# Patient Record
Sex: Female | Born: 1945 | Race: White | Hispanic: No | Marital: Married | Smoking: Former smoker
Health system: Southern US, Community
[De-identification: ages and names within clinical notes are randomized; demographics above are authoritative.]

## PROBLEM LIST (undated history)

## (undated) DIAGNOSIS — R001 Bradycardia, unspecified: Secondary | ICD-10-CM

## (undated) DIAGNOSIS — I10 Essential (primary) hypertension: Secondary | ICD-10-CM

## (undated) DIAGNOSIS — R740 Nonspecific elevation of levels of transaminase and lactic acid dehydrogenase [LDH]: Secondary | ICD-10-CM

## (undated) DIAGNOSIS — E119 Type 2 diabetes mellitus without complications: Secondary | ICD-10-CM

## (undated) DIAGNOSIS — R7401 Elevation of levels of liver transaminase levels: Secondary | ICD-10-CM

## (undated) HISTORY — DX: Type 2 diabetes mellitus without complications: E11.9

## (undated) HISTORY — DX: Elevation of levels of liver transaminase levels: R74.01

## (undated) HISTORY — DX: Essential (primary) hypertension: I10

## (undated) HISTORY — DX: Bradycardia, unspecified: R00.1

## (undated) HISTORY — DX: Nonspecific elevation of levels of transaminase and lactic acid dehydrogenase (ldh): R74.0

---

## 1998-12-04 ENCOUNTER — Other Ambulatory Visit: Admission: RE | Admit: 1998-12-04 | Discharge: 1998-12-04 | Payer: Self-pay | Admitting: Family Medicine

## 1998-12-07 ENCOUNTER — Other Ambulatory Visit: Admission: RE | Admit: 1998-12-07 | Discharge: 1998-12-07 | Payer: Self-pay | Admitting: Family Medicine

## 2001-03-17 ENCOUNTER — Other Ambulatory Visit: Admission: RE | Admit: 2001-03-17 | Discharge: 2001-03-17 | Payer: Self-pay | Admitting: Family Medicine

## 2003-02-21 ENCOUNTER — Other Ambulatory Visit: Admission: RE | Admit: 2003-02-21 | Discharge: 2003-02-21 | Payer: Self-pay | Admitting: Family Medicine

## 2003-06-15 ENCOUNTER — Inpatient Hospital Stay (HOSPITAL_COMMUNITY): Admission: RE | Admit: 2003-06-15 | Discharge: 2003-06-16 | Payer: Self-pay | Admitting: Neurosurgery

## 2003-08-21 ENCOUNTER — Encounter: Admission: RE | Admit: 2003-08-21 | Discharge: 2003-09-27 | Payer: Self-pay | Admitting: Neurosurgery

## 2004-02-23 ENCOUNTER — Other Ambulatory Visit: Admission: RE | Admit: 2004-02-23 | Discharge: 2004-02-23 | Payer: Self-pay | Admitting: Family Medicine

## 2004-10-18 ENCOUNTER — Ambulatory Visit: Payer: Self-pay | Admitting: Internal Medicine

## 2004-10-21 ENCOUNTER — Ambulatory Visit: Payer: Self-pay | Admitting: Cardiology

## 2004-10-24 ENCOUNTER — Ambulatory Visit: Payer: Self-pay | Admitting: Cardiology

## 2004-10-31 ENCOUNTER — Ambulatory Visit: Payer: Self-pay | Admitting: Cardiology

## 2004-11-06 ENCOUNTER — Ambulatory Visit: Payer: Self-pay | Admitting: Cardiology

## 2004-11-06 ENCOUNTER — Inpatient Hospital Stay (HOSPITAL_BASED_OUTPATIENT_CLINIC_OR_DEPARTMENT_OTHER): Admission: RE | Admit: 2004-11-06 | Discharge: 2004-11-06 | Payer: Self-pay | Admitting: Cardiology

## 2004-11-20 ENCOUNTER — Ambulatory Visit: Payer: Self-pay | Admitting: Cardiology

## 2005-01-21 ENCOUNTER — Ambulatory Visit: Payer: Self-pay | Admitting: Cardiology

## 2005-03-21 ENCOUNTER — Other Ambulatory Visit: Admission: RE | Admit: 2005-03-21 | Discharge: 2005-03-21 | Payer: Self-pay | Admitting: Family Medicine

## 2005-04-04 ENCOUNTER — Ambulatory Visit (HOSPITAL_COMMUNITY): Admission: RE | Admit: 2005-04-04 | Discharge: 2005-04-04 | Payer: Self-pay | Admitting: Otolaryngology

## 2005-04-09 ENCOUNTER — Ambulatory Visit: Payer: Self-pay | Admitting: Cardiology

## 2005-04-25 ENCOUNTER — Ambulatory Visit: Payer: Self-pay | Admitting: Internal Medicine

## 2005-05-07 ENCOUNTER — Ambulatory Visit: Payer: Self-pay | Admitting: Cardiology

## 2005-07-02 ENCOUNTER — Ambulatory Visit: Payer: Self-pay | Admitting: Internal Medicine

## 2006-06-26 IMAGING — CT CT PARANASAL SINUSES LIMITED
1 series · 10 of 12 positions shown, 13 images · IV contrast (agent unspecified)
Comparison: None.

CLINICAL DATA: Chronic sinusitis.
 LIMITED CT OF PARANASAL SINUSES WITHOUT CONTRAST:
TECHNIQUE: Limited coronal CT images were obtained through the paranasal sinuses without intravenous contrast.

[Series 9206: — · axial · 0.35mm/px · z∈[-648,-558]mm · 10 of 12 slices shown, 13 images]
[im 2/12  brain]
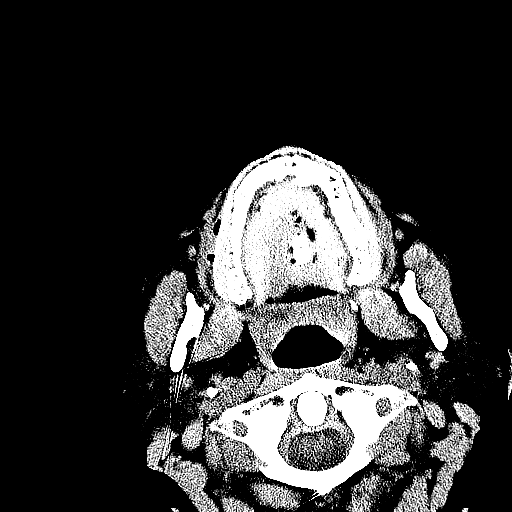
[im 2/12  bone]
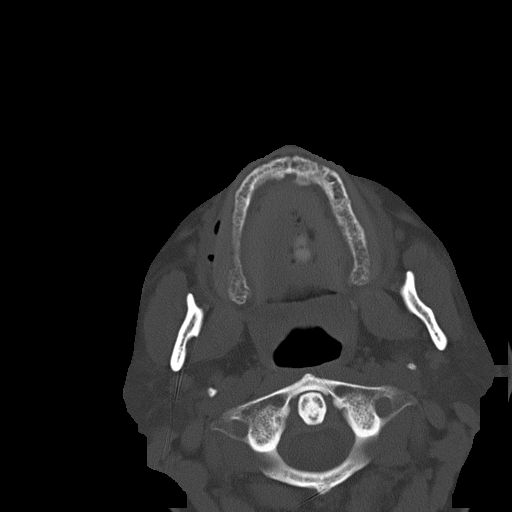
[im 3/12  bone]
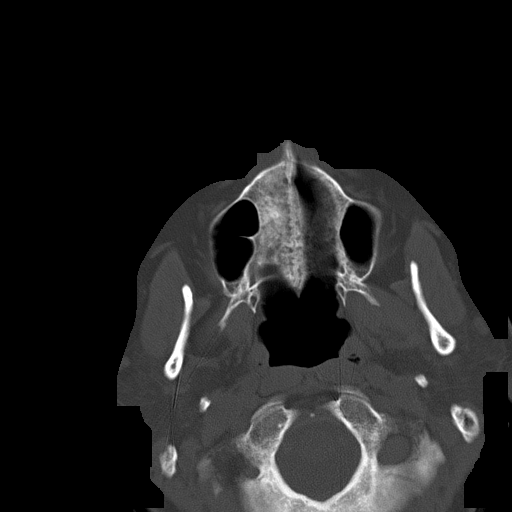
[im 4/12  bone]
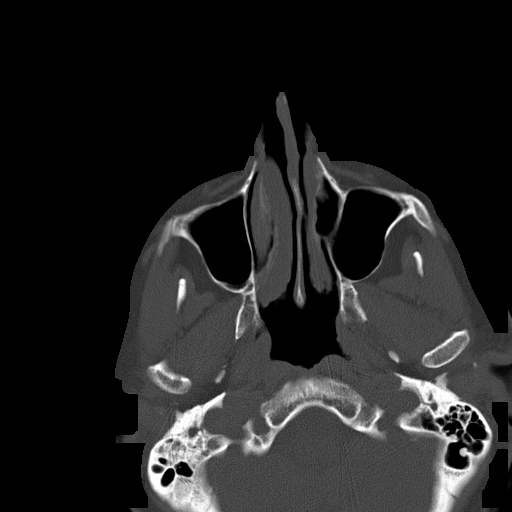
[im 5/12  bone]
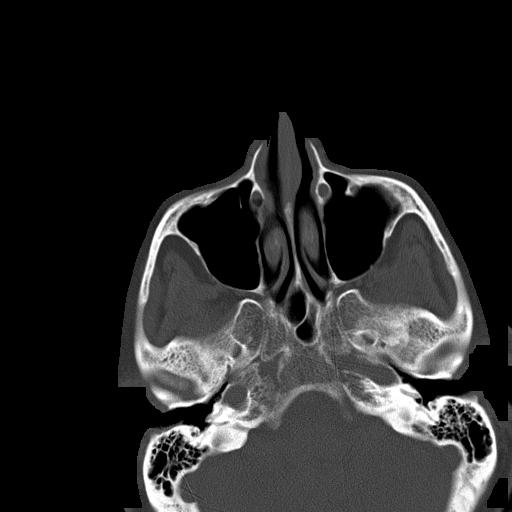
[im 6/12  brain]
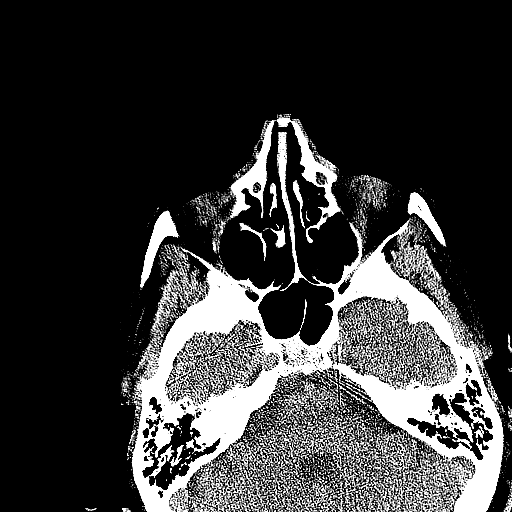
[im 6/12  bone]
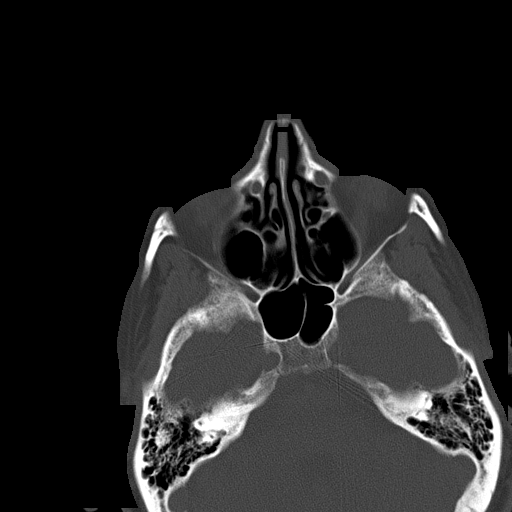
[im 7/12  bone]
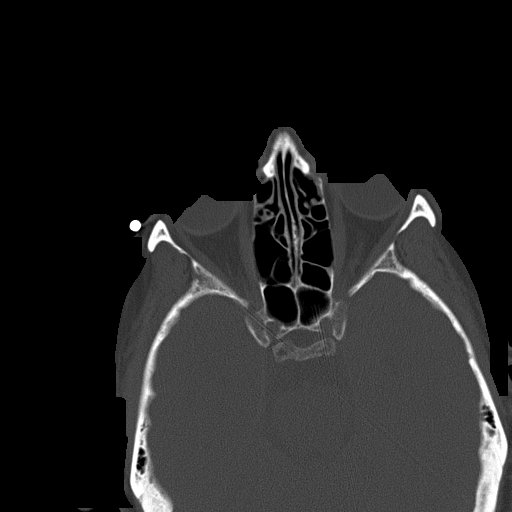
[im 8/12  bone]
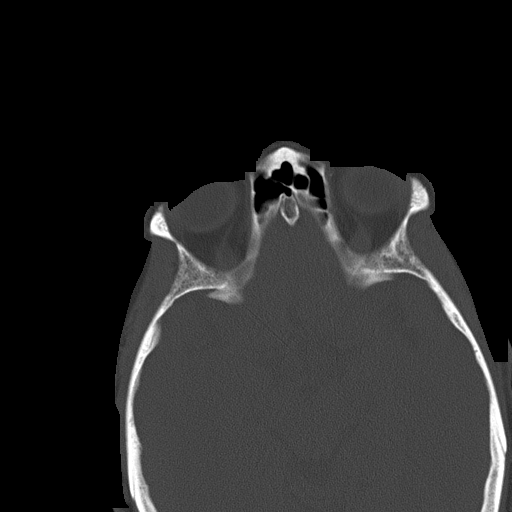
[im 9/12  bone]
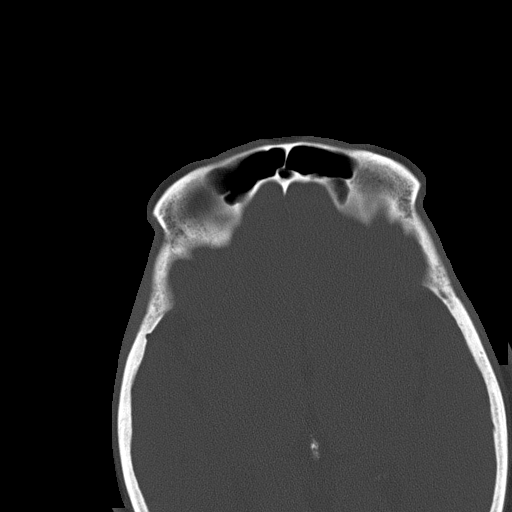
[im 10/12  brain]
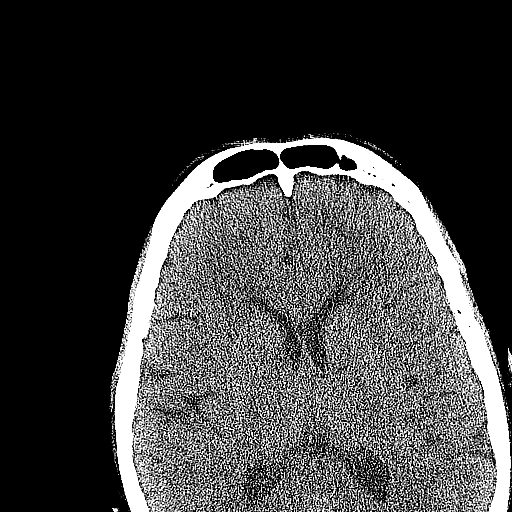
[im 10/12  bone]
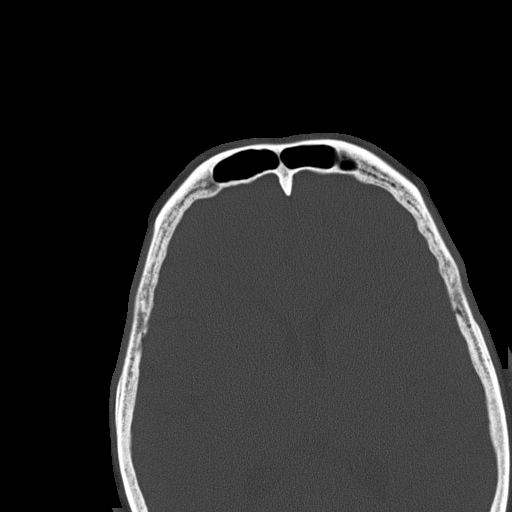
[im 11/12  bone]
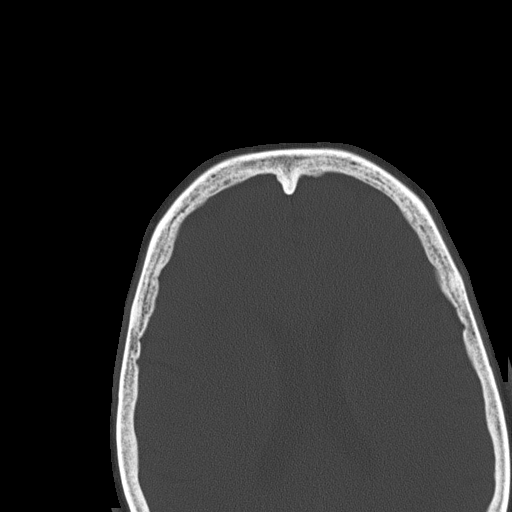

[10 of 12 positions shown; findings below may reference images not displayed]

FINDINGS: Noncontrast CT of the paranasal sinuses demonstrates patent sinuses.  There is no evidence for chronic or acute sinus disease.
IMPRESSION: Normal sinus CT.

## 2006-11-03 ENCOUNTER — Ambulatory Visit: Payer: Self-pay | Admitting: Internal Medicine

## 2006-11-03 LAB — CONVERTED CEMR LAB
Basophils Absolute: 0 10*3/uL (ref 0.0–0.1)
Basophils Relative: 0.4 % (ref 0.0–1.0)
Eosinophils Absolute: 0.1 10*3/uL (ref 0.0–0.6)
Eosinophils Relative: 1.7 % (ref 0.0–5.0)
HCT: 39.7 % (ref 36.0–46.0)
Hemoglobin: 13.9 g/dL (ref 12.0–15.0)
Lymphocytes Relative: 26.5 % (ref 12.0–46.0)
MCHC: 34.9 g/dL (ref 30.0–36.0)
MCV: 87.9 fL (ref 78.0–100.0)
Monocytes Absolute: 0.6 10*3/uL (ref 0.2–0.7)
Monocytes Relative: 7.5 % (ref 3.0–11.0)
Neutro Abs: 5.5 10*3/uL (ref 1.4–7.7)
Neutrophils Relative %: 63.9 % (ref 43.0–77.0)
Platelets: 216 10*3/uL (ref 150–400)
RBC: 4.52 M/uL (ref 3.87–5.11)
RDW: 12.3 % (ref 11.5–14.6)
TSH: 1.2 microintl units/mL (ref 0.35–5.50)
WBC: 8.4 10*3/uL (ref 4.5–10.5)

## 2006-11-10 ENCOUNTER — Ambulatory Visit: Payer: Self-pay | Admitting: Internal Medicine

## 2006-11-18 ENCOUNTER — Ambulatory Visit: Payer: Self-pay | Admitting: Internal Medicine

## 2006-11-26 ENCOUNTER — Ambulatory Visit: Payer: Self-pay | Admitting: Cardiology

## 2007-08-02 ENCOUNTER — Ambulatory Visit: Payer: Self-pay | Admitting: Internal Medicine

## 2007-08-09 ENCOUNTER — Ambulatory Visit: Payer: Self-pay | Admitting: Cardiology

## 2008-10-13 ENCOUNTER — Encounter: Payer: Self-pay | Admitting: Internal Medicine

## 2008-10-13 ENCOUNTER — Ambulatory Visit: Payer: Self-pay | Admitting: Internal Medicine

## 2008-10-13 DIAGNOSIS — I498 Other specified cardiac arrhythmias: Secondary | ICD-10-CM

## 2008-10-13 DIAGNOSIS — I10 Essential (primary) hypertension: Secondary | ICD-10-CM | POA: Insufficient documentation

## 2009-10-08 ENCOUNTER — Telehealth: Payer: Self-pay | Admitting: Internal Medicine

## 2009-10-08 ENCOUNTER — Encounter: Admission: RE | Admit: 2009-10-08 | Discharge: 2010-01-06 | Payer: Self-pay | Admitting: Rheumatology

## 2009-10-25 ENCOUNTER — Ambulatory Visit: Payer: Self-pay | Admitting: Internal Medicine

## 2009-10-25 DIAGNOSIS — E119 Type 2 diabetes mellitus without complications: Secondary | ICD-10-CM

## 2010-08-27 NOTE — Assessment & Plan Note (Signed)
Summary: 65 YR F/U  Medications Added HYDROCHLOROTHIAZIDE 25 MG TABS (HYDROCHLOROTHIAZIDE) take 1/2 tablet once daily      Allergies Added: NKDA  CC:  1 yr f/u.  Pt states that she feels well but still has an episode every now and then of the "flip-flop".   .  History of Present Illness: Stephanie Odom comes in without recurrent palpitations over the last year. She has had no significant chest pain. overall she's doing pre-well. Her exercise is limited by her knees. Unfortunately she has gained another 8 pounds  Current Medications (verified): 1)  Metformin Hcl 500 Mg Tabs (Metformin Hcl) .... Once Daily 2)  Amitriptyline Hcl 25 Mg Tabs (Amitriptyline Hcl) .... Take 2 Tabs At Bedtime 3)  Hydrochlorothiazide 25 Mg Tabs (Hydrochlorothiazide) .... Take 1/2 Tablet Once Daily 4)  Atenolol 25 Mg Tabs (Atenolol) .... Take 1 Tablet By Mouth Once A Day 5)  Ranitidine Hcl 150 Mg Tabs (Ranitidine Hcl) .... As Needed 6)  Cyclobenzaprine Hcl 10 Mg Tabs (Cyclobenzaprine Hcl) .... As Needed 7)  Crestor 10 Mg Tabs (Rosuvastatin Calcium) .... Take 1 Tablet By Mouth Once A Day 8)  Lorazepam 1 Mg Tabs (Lorazepam)  Allergies (verified): No Known Drug Allergies  Past History:  Family History: Last updated: 10/10/2008 FHx of diabetes Second hand cigarette exposure FHx hypertension Asthma dyslipidemia GERD  Social History: Last updated: 10/10/2008 [employer] Married  Tobacco Use - Former.  Alcohol Use - no Drug Use - no  Vital Signs:  Patient profile:   65 year old female Height:      62 inches Weight:      164 pounds BMI:     30.10 Pulse rate:   70 / minute Pulse rhythm:   regular BP sitting:   134 / 84  (right arm) Cuff size:   regular  Vitals Entered By: Judithe Modest CMA (October 25, 2009 10:07 AM)  Physical Exam  General:  The patient was alert and oriented in no acute distress. HEENT Normal.  Neck veins were flat, carotids were brisk.  Lungs were clear.  Heart sounds  were regular without murmurs or gallops.  Abdomen was soft with active bowel sounds. There is no clubbing cyanosis or edema. Skin Warm and dry    Impression & Recommendations:  Problem # 1:  HYPERTENSION, BENIGN (ICD-401.1) her blood pressure is up a little bit. Is not certain why she is not on an ACE inhibitor as she should be with her diabetes for renal protection. I will defer this to her primary care physician for right now. Her updated medication list for this problem includes:    Hydrochlorothiazide 25 Mg Tabs (Hydrochlorothiazide) .Marland Kitchen... Take 1/2 tablet once daily    Atenolol 25 Mg Tabs (Atenolol) .Marland Kitchen... Take 1 tablet by mouth once a day  Problem # 2:  SINUS TACHYCARDIA (ICD-427.89) heart rate is well controlled today on low-dose atenolol. Her updated medication list for this problem includes:    Atenolol 25 Mg Tabs (Atenolol) .Marland Kitchen... Take 1 tablet by mouth once a day  Orders: EKG w/ Interpretation (93000)  Problem # 3:  DM (ICD-250.00) issues are as above.   Her updated medication list for this problem includes:    Metformin Hcl 500 Mg Tabs (Metformin hcl) ..... Once daily  Patient Instructions: 1)  Your physician wants you to follow-up in: 12 months with Dr Graciela Husbands.  You will receive a reminder letter in the mail two months in advance. If you don't receive a letter, please call  our office to schedule the follow-up appointment.

## 2010-08-27 NOTE — Progress Notes (Signed)
Summary: refill meds   Phone Note Refill Request Call back at Home Phone [phone] Message from:  Patient on October 08, 2009 3:06 PM  Refills Requested: Medication #1:  ATENOLOL 25 MG TABS Take 1 tablet by mouth once a day [facility] [phone]   Method Requested: Fax to Mail Away Pharmacy Initial call taken by: Lorne Skeens,  October 08, 2009 3:07 PM    Prescriptions: ATENOLOL 25 MG TABS (ATENOLOL) Take 1 tablet by mouth once a day  #30 x 10   Entered by:   Judithe Modest CMA   Authorized by:   Nathen May, MD, [facility]   Signed by:   Judithe Modest CMA on 10/08/2009   Method used:   Electronically to        [facility] (mail-order)             ,          Ph: [phone]       Fax: [phone]   RxID:   [id number]

## 2010-11-04 ENCOUNTER — Encounter: Payer: Self-pay | Admitting: Internal Medicine

## 2010-11-06 ENCOUNTER — Encounter: Payer: Self-pay | Admitting: Internal Medicine

## 2010-11-07 ENCOUNTER — Other Ambulatory Visit: Payer: Self-pay | Admitting: Internal Medicine

## 2010-11-11 ENCOUNTER — Ambulatory Visit (INDEPENDENT_AMBULATORY_CARE_PROVIDER_SITE_OTHER): Payer: Medicare Other | Admitting: Internal Medicine

## 2010-11-11 ENCOUNTER — Encounter: Payer: Self-pay | Admitting: Internal Medicine

## 2010-11-11 DIAGNOSIS — I471 Supraventricular tachycardia: Secondary | ICD-10-CM

## 2010-11-11 DIAGNOSIS — E785 Hyperlipidemia, unspecified: Secondary | ICD-10-CM

## 2010-11-11 DIAGNOSIS — R079 Chest pain, unspecified: Secondary | ICD-10-CM | POA: Insufficient documentation

## 2010-11-11 MED ORDER — SIMVASTATIN 40 MG PO TABS: 40.0000 mg | ORAL_TABLET | Freq: Every evening | ORAL | Status: DC

## 2010-11-11 NOTE — Assessment & Plan Note (Signed)
stable °

## 2010-11-11 NOTE — Progress Notes (Signed)
  HPI  Stephanie Odom is a 65 y.o. female Seen in followup for sinus tachycardia  Treated with low dose beta blockers.  She also takes the vagolytic amitriptyline   The patient denies chest pain, shortness of breath, nocturnal dyspnea, orthopnea or peripheral edema.  There have been no   lightheadedness or syncope. She has occassional palpitations which prompt her to take a deep breaths  She has occasional midsternal chest pains. These are primarily postprandial. Review of her old chart demonstrated a normal Myoview in 2009  She is working on exercise for both blood pressure and diabetes.  Last HGB a1c earlier this month was 7.1 doewn from 7.9;  Her  TG were up and her statin ws changed from crestor to simva for  Cost considereations   Past Medical History  Diagnosis Date  . HTN (hypertension)   . Sinus bradycardia   . DM (diabetes mellitus)   . Chest pain     history of    No past surgical history on file.  Current Outpatient Prescriptions  Medication Sig Dispense Refill  . amitriptyline (ELAVIL) 25 MG tablet Take 50 mg by mouth at bedtime. 2 tablets      . atenolol (TENORMIN) 25 MG tablet TAKE 1 TABLET DAILY  30 tablet  9  . cyclobenzaprine (FLEXERIL) 10 MG tablet Take 10 mg by mouth as needed.        . Fiber, Guar Gum, CHEW Chew 1 tablet by mouth daily.        . hydrochlorothiazide 25 MG tablet Take 12.5 mg by mouth daily.        Marland Kitchen LORazepam (ATIVAN) 1 MG tablet every 8 (eight) hours as needed.       . metFORMIN (GLUMETZA) 500 MG (MOD) 24 hr tablet Take 500 mg by mouth 2 (two) times daily with a meal.       . Multiple Vitamin (MULTIVITAMIN) capsule Take 1 capsule by mouth daily.        . Omega-3 Fatty Acids (FISH OIL) 1200 MG CAPS Take 2 capsules by mouth daily.        . ranitidine (ZANTAC) 150 MG tablet Take 150 mg by mouth as needed.        . rosuvastatin (CRESTOR) 10 MG tablet Take 10 mg by mouth daily.        . vitamin E (VITAMIN E) 400 UNIT capsule Take 400 Units by mouth  daily.          No Known Allergies  Review of Systems negative except from HPI and PMH  Physical Exam Well developed and well nourished older female in no acute distress HENT normal E scleral and icterus clear Neck Supple JVP flat; carotids brisk and full Clear to ausculation Regular rate and rhythm, no murmurs gallops or rub Soft with active bowel sounds No clubbing cyanosis and edema Alert and oriented, grossly normal motor and sensory function Skin Warm and Dry  ECG  Sinus at 73 Intervals .14/.08./.42 rsr'  Ow normal Assessment and  Plan

## 2010-11-11 NOTE — Patient Instructions (Signed)
Your physician recommends that you schedule a follow-up appointment in: YEAR WITH DR KLEIN  Your physician recommends that you continue on your current medications as directed. Please refer to the Current Medication list given to you today. 

## 2010-11-11 NOTE — Assessment & Plan Note (Signed)
Doing well with atenolol continue;; may help her palps which are likely PVCs

## 2010-11-11 NOTE — Assessment & Plan Note (Signed)
Hgb A1C is improveed  Have encouraged her to continue her exercise

## 2010-12-10 NOTE — Assessment & Plan Note (Signed)
 HEALTHCARE                         ELECTROPHYSIOLOGY OFFICE NOTE   NAME:[name], [name]                      MRN:          [id number]  DATE:08/02/2007                            DOB:          10-21-1945    Ms. Stephanie Odom comes in. Her palpitations are better. She is having  episodes of chest pain which are midsternal. They are not necessarily  related to exertion. They have been increasingly __________  over the  last couple of months. They do not radiate.   Her cardiac risk factors are notable for diabetes, blood pressure and  second-hand cigarette exposure.   On examination, her blood pressure is 137/86. Her pulse is 74.  Her lungs were clear.  HEART:  Sounds were regular.  EXTREMITIES:  Without edema.  SKIN:  Warm an dry.   Electrocardiogram demonstrated sinus rhythm with intervals of  0.14/0.08/0.39. The heart rhythm was otherwise normal.   We will plan to see her again in one year's time, but we will plan to  get a stress test up at [facility] in the interim.   Further evaluation will depend on what that shows.     Duke Salvia, MD, [facility]  Electronically Signed    SCK/MedQ  DD: 08/02/2007  DT: 08/02/2007  Job #: [id number]

## 2010-12-13 NOTE — Op Note (Signed)
NAMEJENNIFE, Stephanie Odom                         ACCOUNT NO.:  [id number]   MEDICAL RECORD NO.:  [id number]                   PATIENT TYPE:  INP   LOCATION:  2894                                 FACILITY:  [facility]   PHYSICIAN:  Clydene Fake, M.D.               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   POSTOPERATIVE DIAGNOSIS:  Herniated nucleus pulposus and spondylosis at C5-  C6 with left sided radiculopathy and myelopathy.   PROCEDURE:  Anterior cervical decompression, discectomy, and fusion at C5-C6  with left sided allograft and anterior cervical plate.   SURGEON:  Clydene Fake, M.D.   ANESTHESIA:  General endotracheal anesthesia.   ESTIMATED BLOOD LOSS:  25 mL, no blood given.   DRAINS:  None.   COMPLICATIONS:  None.   INDICATIONS FOR PROCEDURE:  The patient is a 65 year old woman who has had  neck pain and pain down the left arm with some instability in gait and other  findings of myelopathy but very subtle.  The patient was found to have a  large disc herniation on the left side at C5-C6 compressing the spinal cord.  The patient is brought in for decompression and fusion.   PROCEDURE IN DETAIL:  The patient was brought to the operating room, general  anesthesia was induced, the patient was placed in 10 pounds of halter  traction, was prepped and draped in the usual sterile fashion.  The site of  incision was injected with 10 mL of 1% lidocaine with epinephrine.  The  incision was then made from the midline to the anterior border of the  sternocleidomastoid muscle on the left side of the neck.  The incision was  taken down to the platysma and hemostasis obtained with Bovie cautery.  The  platysma was incised with the Bovie and blunt dissection was taken  to the  anterior cervical fascia to the anterior cervical spine.  A needle was  placed in the interspace and x-ray was obtained assuring this was the 5-6  interspace.  The disc space was incised with a 15 blade and partial  discectomy performed as the needle was removed.  The longus colli muscle was  reflected laterally on each side.  Distraction pins were placed into C5 and  C6 and the interspace was distracted.  The discectomy was then performed  with the curets, pituitary rongeurs.  The microscope was brought onto the  field for microdissection.  The posterior ligament osteophytes were removed  with 1 and 2 mm Kerrison punches.  Bilateral foraminotomies were performed.  There was calcified ligament and disc pushing in the left side.  This was  removed.  When we were finished, we had good decompression of  the cord and  bilateral foramen.  Hemostasis was obtained with Gelfoam and thrombin.  A  small piece of Gelfoam was left on the left side just over the root.  A high  speed drill was used to remove the cartilaginous endplates.  The disc space  was measured with a bone trowel measured to be 6 mm.  We roughed up the  endplate with the broach and then tapped a 6 mm allograft bone into place  counter sinking it about 1 mm.  The distraction pins were removed.  The  weight was removed from the traction.  The anterior cervical plate was  placed over the anterior cervical spine, two screws were placed into C5 and  two into C6.  These were final tightened and x-rays were obtained showing  good position of the plate and screws and interbody bone plug.  The  retractors were removed.  The wound was irrigated with antibiotic solution  and hemostasis was obtained with bipolar cauterization.  The platysma was  closed with 3-0 Vicryl interrupted sutures, the subcutaneous tissue were  closed with the same, the skin was closed with Dermabond skin glue.  A dry  dressing was placed.  The patient was placed  in a soft cervical collar,  awakened from anesthesia, and transferred to the recovery room in stable  condition.                                               Clydene Fake, M.D.    JRH/MEDQ  D:  06/15/2003  T:  06/15/2003  Job:  [id number]

## 2010-12-13 NOTE — Cardiovascular Report (Signed)
Stephanie Odom, FRUCHTER               ACCOUNT NO.:  [id number]   MEDICAL RECORD NO.:  [id number]          PATIENT TYPE:  OIB   LOCATION:  NA                           FACILITY:  [facility]   PHYSICIAN:  Salvadore Farber, M.D. LHCDATE OF BIRTH:  [birth year]   DATE OF PROCEDURE:  11/06/2004  DATE OF DISCHARGE:                              CARDIAC CATHETERIZATION   PROCEDURE:  Left heart catheterization, left ventriculography, coronary  angiography.   INDICATIONS:  Ms. Feliz is a 65 year old lady with asthma and  gastroesophageal reflux disease as well as dyslipidemia, who presents with  chest discomfort, shortness of breath, and palpitations.  Her chest  discomfort has been felt to be atypical for myocardial ischemia.  However,  an adenosine Cardiolite study performed October 24, 2004 raised the question  of distal anterior and apical ischemia.  She was therefore referred for  diagnostic angiography.   PROCEDURAL TECHNIQUE:  Informed consent was obtained.  Under 1% lidocaine  local anesthesia, a 4 French sheath was placed in the right common femoral  artery using the modified Seldinger technique.  Diagnostic angiography and  ventriculography were performed using JL-4, JR-4, and pigtail catheters.  There was catheter-induced spasm at the ostium of the RCA which was relieved  with intracoronary nitroglycerin.  After the administration of the  nitroglycerin, the patient became transiently hypotensive, which responded  promptly to fluid administration.  She was transferred to the holding room,  having tolerated the procedure.  Sheaths will be removed there.   COMPLICATIONS:  None.   FINDINGS:  1.  LV:  90/8/11.  EF 70%, without regional wall motion abnormality.  2.  No aortic stenosis or mitral regurgitation.  3.  Left main:  Short vessel which is angiographically normal.  4.  LAD:  Moderate-size vessel giving rise to two diagonals.  It is      angiographically normal.  5.  Circumflex:   Moderate-size vessel, giving rise to two obtuse marginals.      It is angiographically normal.  6.  RCA:  Relatively small but dominant vessel.  It is angiographically      normal.   IMPRESSION/RECOMMENDATIONS:  1.  Angiographically normal arteries.  2.  Normal left ventricular size and systolic function.  3.  No aortic stenosis or mitral regurgitation.      WED/MEDQ  D:  11/06/2004  T:  11/06/2004  Job:  [id number]   cc:   Ernestina Penna, M.D.  [address] [place]  [place] [address]  Fax: [phone]   Arvilla Meres, M.D. [facility]

## 2010-12-13 NOTE — Assessment & Plan Note (Signed)
 HEALTHCARE                         ELECTROPHYSIOLOGY OFFICE NOTE   NAME:Stephanie Odom                      MRN:          [id number]  DATE:11/18/2006                            DOB:          [birth year]    Stephanie Odom comes in. She was seen a couple of weeks ago. That record  is not available. She underwent Holter monitoring because of  palpitations. This occurred in the setting of asthma.   Her Holter monitor demonstrates a mean heart rate overall of 101 but her  daytime mean appears to be about 110, her nocturnal mean about 85 or so.  She also had a couple of episodes of nonsustained atrial tachycardia.   She is bothered some by this.   Her heart muscle function is not known. She apparently had a bunch of  workup done up in [place] which we do not have access to currently, but  her last LVEF she thinks was a couple of years ago.   PHYSICAL EXAMINATION:  VITAL SIGNS:  Her blood pressure remains elevated  at 148/92. When she was here 2 weeks ago, orthostatics were negative.  She had persistent sinus tachycardia.  LUNGS:  Clear today.  HEART:  Sounds were regular.  EXTREMITIES:  Without edema.   Holter monitor as previously noted.   Blood work was obtained and demonstrated a normal TSH and a normal  hemogram.   IMPRESSION:  1. Sinus tachycardia which appears to be simply a reset sinus node as      the nocturnal heart rate is also fast.  2. Occasional palpitations associated with #1.  3. Asthma.  4. Amitriptyline therapy for sleep.   We will plan to undertake a 2-D echo as left ventricular dysfunction  would prompt [place] to be more aggressive in our therapy.   I have given her prescriptions for metoprolol 25 b.i.d. and atenolol 25  daily to take to see which one of those she tolerates best.   Will plan to see her again in about 3-4 months' time.     Duke Salvia, MD, [facility]  Electronically Signed    SCK/MedQ  DD: 11/18/2006  DT:  11/18/2006  Job #: [id number]

## 2011-11-13 ENCOUNTER — Encounter: Payer: Self-pay | Admitting: Internal Medicine

## 2011-11-13 ENCOUNTER — Ambulatory Visit (INDEPENDENT_AMBULATORY_CARE_PROVIDER_SITE_OTHER): Payer: Medicare Other | Admitting: Internal Medicine

## 2011-11-13 VITALS — BP 128/80 | HR 80 | Ht 62.5 in | Wt 153.4 lb

## 2011-11-13 DIAGNOSIS — I498 Other specified cardiac arrhythmias: Secondary | ICD-10-CM

## 2011-11-13 DIAGNOSIS — R000 Tachycardia, unspecified: Secondary | ICD-10-CM

## 2011-11-13 DIAGNOSIS — F419 Anxiety disorder, unspecified: Secondary | ICD-10-CM | POA: Insufficient documentation

## 2011-11-13 NOTE — Assessment & Plan Note (Signed)
Much improved we'll continue her on low-dose beta blockers

## 2011-11-13 NOTE — Assessment & Plan Note (Signed)
She related some stories about her marriage. We have suggested [facility] as a potential help for her

## 2011-11-13 NOTE — Patient Instructions (Signed)
Your physician wants you to follow-up in: 1 year with Dr. Graciela Husbands. You will receive a reminder letter in the mail two months in advance. If you don't receive a letter, please call our office to schedule the follow-up appointment.  Your physician recommends that you continue on your current medications as directed. Please refer to the Current Medication list given to you today.  [facility]- [phone]

## 2011-11-13 NOTE — Progress Notes (Signed)
HPI  Stephanie Odom is a 66 y.o. female Seen in followup for sinus tachycardia  Treated with low dose beta blockers.  She also takes the vagolytic amitriptyline   The patient denies chest pain, shortness of breath, nocturnal dyspnea, orthopnea or peripheral edema.  There have been no   lightheadedness or syncope. She has occassional palpitations which prompt her to take a deep breaths   . Review of her old chart demonstrated a normal Myoview in 2009  She is working on exercise for both blood pressure and diabetes.   Her major stress in her life she says is her marriage It has been very difficult. She is seeking some relief.  Past Medical History  Diagnosis Date  . HTN (hypertension)   . Sinus bradycardia   . DM (diabetes mellitus)   . Chest pain     history of  . Elevated transaminase level     Being followed by PCP    No past surgical history on file.  Current Outpatient Prescriptions  Medication Sig Dispense Refill  . amitriptyline (ELAVIL) 25 MG tablet Take 50 mg by mouth at bedtime. 2 tablets      . atenolol (TENORMIN) 25 MG tablet TAKE 1 TABLET DAILY  30 tablet  9  . cyclobenzaprine (FLEXERIL) 10 MG tablet Take 10 mg by mouth as needed.        . hydrochlorothiazide 25 MG tablet Take 12.5 mg by mouth daily.        . metFORMIN (GLUMETZA) 500 MG (MOD) 24 hr tablet Take 1,000 mg by mouth 2 (two) times daily with a meal.       . Multiple Vitamin (MULTIVITAMIN) capsule Take 1 capsule by mouth daily.        . Omega-3 Fatty Acids (FISH OIL) 1200 MG CAPS Take 2 capsules by mouth daily.        . ranitidine (ZANTAC) 150 MG tablet Take 150 mg by mouth as needed.        . vitamin C (ASCORBIC ACID) 500 MG tablet Take 500 mg by mouth daily.      . vitamin E (VITAMIN E) 400 UNIT capsule Take 400 Units by mouth daily.        . simvastatin (ZOCOR) 40 MG tablet Take 1 tablet (40 mg total) by mouth every evening.  30 tablet  11    No Known Allergies  Review of Systems negative except  from HPI and PMH  Physical Exam BP 128/80  Pulse 80  Ht 5' 2.5" (1.588 m)  Wt 153 lb 6.4 oz (69.582 kg)  BMI 27.61 kg/m2 Well developed and well nourished in no acute distress HENT normal E scleral and icterus clear Neck Supple JVP flat; carotids brisk and full Clear to ausculation Regular rate and rhythm, no murmurs gallops or rub Soft with active bowel sounds No clubbing cyanosis none Edema Alert and oriented, grossly normal motor and sensory function Skin Warm and Dry Affect-engaging the somewhat incongruous from the story is that she tells  Echocardiogram demonstrates sinus rhythm at 80 Intervals 14/08/38 Axis XIV  Assessment and  Plan     The patient denies chest pain, shortness of breath, nocturnal dyspnea, orthopnea or peripheral edema.  There have been no   lightheadedness or syncope. She has occassional palpitations which prompt her to take a deep breaths  She has occasional midsternal chest pains. These are primarily postprandial. Review of her old chart demonstrated a normal Myoview in 2009  She is working on  exercise for both blood pressure and diabetes.  Last HGB a1c earlier this month was 7.1 doewn from 7.9;  Her  TG were up and her statin ws changed from crestor to simva for  Cost considereations on is not associated

## 2011-11-13 NOTE — Assessment & Plan Note (Signed)
Settled down at this time

## 2011-11-13 NOTE — Assessment & Plan Note (Signed)
Currently well controlled.  

## 2013-04-12 ENCOUNTER — Encounter: Payer: Self-pay | Admitting: Internal Medicine

## 2013-04-12 ENCOUNTER — Ambulatory Visit (INDEPENDENT_AMBULATORY_CARE_PROVIDER_SITE_OTHER): Payer: Medicare Other | Admitting: Internal Medicine

## 2013-04-12 VITALS — BP 162/88 | HR 82 | Ht 62.5 in | Wt 148.0 lb

## 2013-04-12 DIAGNOSIS — I471 Supraventricular tachycardia: Secondary | ICD-10-CM

## 2013-04-12 MED ORDER — ATENOLOL 25 MG PO TABS: 25.0000 mg | ORAL_TABLET | Freq: Every day | ORAL | Status: DC

## 2013-04-12 NOTE — Progress Notes (Signed)
  HPI  Stephanie Odom is a 67 y.o. female Seen in followup for sinus tachycardia  Treated with low dose beta blockers.  She also takes the vagolytic amitriptyline   The patient denies chest pain, shortness of breath, nocturnal dyspnea, orthopnea or peripheral edema.  There have been no   lightheadedness or syncope. She has occassional palpitations which prompt her to take a deep breaths   . Review of her old chart demonstrated a normal Myoview in 2009  She is working on exercise for both blood pressure and diabetes.   Her major stress in her life she says is her marriage It has been very difficult. She is seeking some relief.  Past Medical History  Diagnosis Date  . HTN (hypertension)   . Sinus bradycardia   . DM (diabetes mellitus)   . Chest pain     history of  . Elevated transaminase level     Being followed by PCP    No past surgical history on file.  Current Outpatient Prescriptions  Medication Sig Dispense Refill  . amitriptyline (ELAVIL) 25 MG tablet Take 50 mg by mouth at bedtime. 2 tablets      . atenolol (TENORMIN) 25 MG tablet TAKE 1 TABLET DAILY  30 tablet  9  . cyclobenzaprine (FLEXERIL) 10 MG tablet Take 10 mg by mouth as needed.        . hydrochlorothiazide 25 MG tablet Take 12.5 mg by mouth daily.        . meclizine (ANTIVERT) 25 MG tablet Take 25 mg by mouth. 4 TIMES DAILY      . metFORMIN (GLUMETZA) 500 MG (MOD) 24 hr tablet Take 1,000 mg by mouth 2 (two) times daily with a meal.       . Multiple Vitamin (MULTIVITAMIN) capsule Take 1 capsule by mouth daily.        . Omega-3 Fatty Acids (FISH OIL) 1200 MG CAPS Take 2 capsules by mouth daily.        . ranitidine (ZANTAC) 150 MG tablet Take 150 mg by mouth as needed.        . simvastatin (ZOCOR) 40 MG tablet Take 1 tablet (40 mg total) by mouth every evening.  30 tablet  11  . vitamin C (ASCORBIC ACID) 500 MG tablet Take 500 mg by mouth daily.      . vitamin E (VITAMIN E) 400 UNIT capsule Take 400 Units by  mouth daily.         No current facility-administered medications for this visit.    No Known Allergies  Review of Systems negative except from HPI and PMH  Physical Exam BP 162/88  Pulse 82  Ht 5' 2.5" (1.588 m)  Wt 148 lb (67.132 kg)  BMI 26.62 kg/m2  SpO2 96% Well developed and well nourished in no acute distress HENT normal E scleral and icterus clear Neck Supple JVP flat; carotids brisk and full Clear to ausculation Regular rate and rhythm, no murmurs gallops or rub Soft with active bowel sounds No clubbing cyanosis none Edema Alert and oriented, grossly normal motor and sensory function Skin Warm and Dry Affect-engaging    ECG demonstrates sinus rhythm at 82 intervals 13/07/37   Assessment and  Plan     Sinus tachycardia: without symptoms and continue her low dose beta blocker.

## 2013-04-12 NOTE — Patient Instructions (Addendum)
Your physician recommends that you continue on your current medications as directed. Please refer to the Current Medication list given to you today.   Your physician wants you to follow-up as needed with Dr. Graciela Husbands.

## 2013-07-04 ENCOUNTER — Telehealth: Payer: Self-pay | Admitting: Internal Medicine

## 2013-07-04 NOTE — Telephone Encounter (Signed)
New problem    Pt was prescribed 2 med's by her PCP Dr Sharrell Ku,  LISINOPRIL 10 mg &  YDROCHLOROT 25 mg.   Pt was told to STOP! ATENONOL 25 mg.  Pt would like to know if this is all right.  If you want pt to keep taking the Atenonol 90 days, she needs it called in.   Please give pt a call.

## 2013-07-04 NOTE — Telephone Encounter (Signed)
Spoke with Stephanie Odom. She reports she saw Dr. Anselm Lis recently and was told to stop Atenolol because she was on Lisinopril 10 mg daily and HCTZ 12.5 mg daily. She wants to check with Dr. Graciela Husbands to see if she should make this change.  HCTZ was on medication list when Stephanie Odom last saw Dr. Graciela Husbands. Stephanie Odom checked Lisinopril bottle and it was dated 03/07/13 so she was taking when here for last office visit but it is not on current medication list. If Dr. Graciela Husbands wants her to continue to atenolol she will need refill sent to [facility].

## 2013-07-05 ENCOUNTER — Other Ambulatory Visit: Payer: Self-pay | Admitting: *Deleted

## 2013-07-05 MED ORDER — ATENOLOL 25 MG PO TABS: 25.0000 mg | ORAL_TABLET | Freq: Every day | ORAL | Status: DC

## 2013-07-05 NOTE — Telephone Encounter (Signed)
Advised patient to continue taking Atenolol, per Dr. Graciela Husbands. Refill sent in to [facility] per pt request.

## 2014-06-30 ENCOUNTER — Other Ambulatory Visit: Payer: Self-pay | Admitting: Internal Medicine

## 2014-07-01 NOTE — Telephone Encounter (Signed)
Rx was sent to pharmacy electronically. 

## 2015-02-26 ENCOUNTER — Telehealth: Payer: Self-pay | Admitting: Family Medicine

## 2015-02-27 NOTE — Telephone Encounter (Signed)
Requesting record from [facility]

## 2015-03-02 ENCOUNTER — Institutional Professional Consult (permissible substitution): Payer: Medicare Other | Admitting: Internal Medicine

## 2016-06-16 ENCOUNTER — Ambulatory Visit (INDEPENDENT_AMBULATORY_CARE_PROVIDER_SITE_OTHER): Payer: Medicare Other | Admitting: Internal Medicine

## 2016-06-16 ENCOUNTER — Encounter: Payer: Self-pay | Admitting: Internal Medicine

## 2016-06-16 VITALS — BP 144/70 | HR 68

## 2016-06-16 DIAGNOSIS — R0602 Shortness of breath: Secondary | ICD-10-CM

## 2016-06-16 DIAGNOSIS — I471 Supraventricular tachycardia: Secondary | ICD-10-CM

## 2016-06-16 DIAGNOSIS — R5383 Other fatigue: Secondary | ICD-10-CM | POA: Diagnosis not present

## 2016-06-16 NOTE — Patient Instructions (Addendum)
Medication Instructions: - Your physician recommends that you continue on your current medications as directed. Please refer to the Current Medication list given to you today.  Labwork: None Ordered  Procedures/Testing: Your physician has requested that you have an echocardiogram. Echocardiography is a painless test that uses sound waves to create images of your heart. It provides your doctor with information about the size and shape of your heart and how well your heart's chambers and valves are working. This procedure takes approximately one hour. There are no restrictions for this procedure.  Follow-Up: Your physician wants you to follow-up in: 1 YEAR with Dr. Graciela HusbandsKlein. You will receive a reminder letter in the mail two months in advance. If you don't receive a letter, please call our office to schedule the follow-up appointment.   Any Additional Special Instructions Will Be Listed Below (If Applicable).     If you need a refill on your cardiac medications before your next appointment, please call your pharmacy.

## 2016-06-16 NOTE — Progress Notes (Signed)
Patient Care Team: Elder Negusavid Sanders, PA-C as PCP - General (Physician Assistant)   HPI  Stephanie Odom is a 70 y.o. female  Seen after a hiatus of more than 3 years. She had been seen previously for sinus tachycardia  Treated with low dose beta blockers.  She also takes the vagolytic amitriptyline   The patient denies chest pain,  There have been no   lightheadedness or syncope. She has occassional palpitations which prompt her to take a deep breaths  She has dyspnea on exertion. There is been no nocturnal dyspnea, orthopnea or peripheral edema.   She has been intercurrently diagnosed with vertigo and has been treated with meclizine. Her symptoms are occurring with standing. There is no symptoms associated with changing of position of her head while lying flat. There have been some symptoms with abrupt changes in direction. She does not have shower intolerance.   Review of her old chart demonstrated a normal Myoview in 2009     Records and Results Reviewed   Past Medical History:  Diagnosis Date  . Chest pain    history of  . DM (diabetes mellitus) (HCC)   . Elevated transaminase level    Being followed by PCP  . HTN (hypertension)   . Sinus bradycardia     History reviewed. No pertinent surgical history.  Current Outpatient Prescriptions  Medication Sig Dispense Refill  . atenolol (TENORMIN) 25 MG tablet Take 1 tablet (25 mg total) by mouth daily. NEED APPOINTMENT FOR FUTURE REFILLS OR 90-DAY SUPPLY. 30 tablet 0  . lisinopril (PRINIVIL,ZESTRIL) 10 MG tablet Take 1 tablet by mouth daily.    Marland Kitchen. LORazepam (ATIVAN) 1 MG tablet Take 1 tablet by mouth daily as needed for sleep.    . meclizine (ANTIVERT) 25 MG tablet Take 25 mg by mouth. 4 TIMES DAILY    . metFORMIN (GLUMETZA) 500 MG (MOD) 24 hr tablet Take 1,000 mg by mouth 2 (two) times daily with a meal.     . Multiple Vitamin (MULTIVITAMIN) capsule Take 1 capsule by mouth daily.      . Omega-3 Fatty Acids (FISH  OIL) 1200 MG CAPS Take 2 capsules by mouth daily.      . ranitidine (ZANTAC) 150 MG tablet Take 150 mg by mouth as needed.      . vitamin C (ASCORBIC ACID) 500 MG tablet Take 500 mg by mouth daily.    . vitamin E (VITAMIN E) 400 UNIT capsule Take 400 Units by mouth daily.      . simvastatin (ZOCOR) 40 MG tablet Take 1 tablet (40 mg total) by mouth every evening. 30 tablet 11   No current facility-administered medications for this visit.     Allergies  Allergen Reactions  . Zostavax [Zoster Vaccine Live] Other (See Comments)    Pt states she received the shingles vaccine and broke out in a rash      Review of Systems negative except from HPI and PMH  Physical Exam BP (!) 144/70 (BP Location: Right Arm, Cuff Size: Large)   Pulse 68   SpO2 95%  Well developed and well nourished in no acute distress HENT normal E scleral and icterus clear Neck Supple JVP flat; carotids brisk and full Clear to ausculation Regular rate and rhythm, no murmurs gallops or rub Soft with active bowel sounds No clubbing cyanosis  Edema Alert and oriented, grossly normal motor and sensory function Skin Warm and Dry  ECG personally reviewed  Sinus with  normal intervals   Assessment and  Plan  Palpitations-- sinus tach  Dizziness/Vertigo  Dyspnea on exertion   Her rhythm issues are quiet  Her vertigo sounds more like orthostatic intolerance, although there was no objective data today to support that.  I have reviewed with her these thoughts and maneuvers to mitigate orhtostatic lightheadedness   I am not sure of the mechanism of her dyspnea  With hypertension there may be some diastolic dysfunction  Will get an echo to look at lV function, systolic and diastolic and look at E/E'  If unrevealing will seend her pulm    Current medicines are reviewed at length with the patient today .  The patient does not  have concerns regarding medicines.

## 2016-07-14 ENCOUNTER — Ambulatory Visit (HOSPITAL_COMMUNITY): Payer: Medicare Other | Attending: Internal Medicine

## 2016-07-14 ENCOUNTER — Other Ambulatory Visit: Payer: Self-pay

## 2016-07-14 DIAGNOSIS — R0602 Shortness of breath: Secondary | ICD-10-CM | POA: Diagnosis not present

## 2016-07-14 DIAGNOSIS — I340 Nonrheumatic mitral (valve) insufficiency: Secondary | ICD-10-CM | POA: Diagnosis not present

## 2016-07-23 ENCOUNTER — Telehealth: Payer: Self-pay | Admitting: Internal Medicine

## 2016-07-23 DIAGNOSIS — R0602 Shortness of breath: Secondary | ICD-10-CM

## 2016-07-23 NOTE — Telephone Encounter (Signed)
Patient was made aware of her echo results. Informed patient that Dr. Graciela HusbandsKlein would like to do a CPX. The patient stated that she has asthma and arthritis in her feet, that she may not be able to do the treadmill test. I informed the patient that there is a Bruce modified test that could be done if she felt like she could tolerate walking or there was another test called a Lexiscan that if she could not tolerate walking she could just receive medication. The patient states that she is willing to try and walk on the treadmill. I informed the patient that I would send a message to Dr. Graciela HusbandsKlein and see which test he would prefer, if she needed to hold her atenolol (she takes one 25 mg tablet once a day in the morning), and if he would like the test scheduled on a day that he is here. Patient was notified that once Dr. Graciela HusbandsKlein gets back to us that someone from our office would contact her. Patient verbalized understanding and was in agreement.

## 2016-07-23 NOTE — Telephone Encounter (Signed)
Pt want to know results of her Echo she had done 12.18.17. Please call pt [phone]

## 2016-08-07 NOTE — Telephone Encounter (Signed)
B  The issue is cardiopulmonary stress testing looking for a cause of shortness of breath; this is different from cardiac stress testing. This is arranged at the hospital. Please clarify this with Stephanie Odom if she has any further questions B no thanks

## 2016-08-15 NOTE — Telephone Encounter (Signed)
I called and spoke with the patient clarified what the CPX test is with her and the reasoning for this test being ordered. She is agreeable with testing at this time. I advised I will order her CPX and have scheduling call her with the date/ time.

## 2016-08-26 ENCOUNTER — Ambulatory Visit (HOSPITAL_COMMUNITY): Payer: Medicare HMO | Attending: Internal Medicine

## 2016-08-26 DIAGNOSIS — R0602 Shortness of breath: Secondary | ICD-10-CM | POA: Insufficient documentation

## 2016-08-27 ENCOUNTER — Other Ambulatory Visit (HOSPITAL_COMMUNITY): Payer: Self-pay | Admitting: *Deleted

## 2016-08-27 DIAGNOSIS — R0602 Shortness of breath: Secondary | ICD-10-CM | POA: Diagnosis not present

## 2017-09-01 ENCOUNTER — Encounter (HOSPITAL_COMMUNITY): Payer: Self-pay | Admitting: *Deleted

## 2017-10-09 ENCOUNTER — Encounter: Payer: Self-pay | Admitting: Internal Medicine

## 2017-10-19 ENCOUNTER — Ambulatory Visit: Payer: Medicare HMO | Admitting: Internal Medicine

## 2017-10-19 ENCOUNTER — Encounter: Payer: Self-pay | Admitting: Internal Medicine

## 2017-10-19 VITALS — BP 114/68 | HR 82 | Ht 62.0 in | Wt 142.0 lb

## 2017-10-19 DIAGNOSIS — R5383 Other fatigue: Secondary | ICD-10-CM | POA: Diagnosis not present

## 2017-10-19 DIAGNOSIS — R0602 Shortness of breath: Secondary | ICD-10-CM

## 2017-10-19 NOTE — Patient Instructions (Signed)
Medication Instructions:  Your physician has recommended you make the following change in your medication:   1. Take Lisinopril at bedtime  Labwork: None ordered.  Testing/Procedures: None ordered.  Follow-Up: Your physician recommends that you schedule a follow-up appointment in:   Follow up with [name] in one year  Any Other Special Instructions Will Be Listed Below (If Applicable).     If you need a refill on your cardiac medications before your next appointment, please call your pharmacy.

## 2017-10-19 NOTE — Progress Notes (Signed)
Patient Care Team: Elder Negus, PA-C as PCP - General (Physician Assistant)   HPI  Stephanie Odom is a 72 y.o. female  Seen after a hiatus of more than 3 years. She had been seen previously for sinus tachycardia  Treated with low dose beta blockers.  She also takes the vagolytic amitriptyline  She has had no palpitations.  No shortness of breath chest pain.  She has occasional peripheral edema.  No orthopnea or nocturnal dyspnea.  No lightheadedness except occasionally after taking her blood pressure medications.  Evening blood pressures at home are in the 140-150 range    DATE TEST EF   2009 Myoview  65 %   12/17 Echo   55-65 %   1/18 CPX  No clear limitations      Records and Results Reviewed   Past Medical History:  Diagnosis Date  . Chest pain    history of  . DM (diabetes mellitus) (HCC)   . Elevated transaminase level    Being followed by PCP  . HTN (hypertension)   . Sinus bradycardia     History reviewed. No pertinent surgical history.  Current Outpatient Medications  Medication Sig Dispense Refill  . atenolol (TENORMIN) 25 MG tablet Take 1 tablet (25 mg total) by mouth daily. NEED APPOINTMENT FOR FUTURE REFILLS OR 90-DAY SUPPLY. 30 tablet 0  . lisinopril (PRINIVIL,ZESTRIL) 10 MG tablet Take 1 tablet by mouth daily.    Marland Kitchen LORazepam (ATIVAN) 1 MG tablet Take 1 tablet by mouth daily as needed for sleep.    . meclizine (ANTIVERT) 25 MG tablet Take 25 mg by mouth. 4 TIMES DAILY    . metFORMIN (GLUMETZA) 500 MG (MOD) 24 hr tablet Take 1,000 mg by mouth 2 (two) times daily with a meal.     . Multiple Vitamin (MULTIVITAMIN) capsule Take 1 capsule by mouth daily.      . Omega-3 Fatty Acids (FISH OIL) 1200 MG CAPS Take 2 capsules by mouth daily.      . ranitidine (ZANTAC) 150 MG tablet Take 150 mg by mouth as needed.      . vitamin C (ASCORBIC ACID) 500 MG tablet Take 500 mg by mouth daily.    . vitamin E (VITAMIN E) 400 UNIT capsule Take 400 Units by  mouth daily.      . simvastatin (ZOCOR) 40 MG tablet Take 1 tablet (40 mg total) by mouth every evening. 30 tablet 11   No current facility-administered medications for this visit.     Allergies  Allergen Reactions  . Zostavax [Zoster Vaccine Live] Other (See Comments)    Pt states she received the shingles vaccine and broke out in a rash      Review of Systems negative except from HPI and PMH  Physical Exam BP 114/68   Pulse 82   Ht 5\' 2"  (1.575 m)   Wt 142 lb (64.4 kg)   SpO2 98%   BMI 25.97 kg/m  Well developed and nourished in no acute distress HENT normal Neck supple with JVP-flat Clear Regular rate and rhythm, no murmurs or gallops Abd-soft with active BS No Clubbing cyanosis edema Skin-warm and dry A & Oriented  Grossly normal sensory and motor function  ECG personally reviewed sinus 71 13/07/38  Assessment and  Plan  Palpitations-- sinus tach  Hypertension  Dizziness/Vertigo  Dyspnea on exertion    Dyspnea on exertion is improved  Blood pressures are reasonably controlled; I will have her take her  lisinopril  in the evenings so as to mitigate a.m. Lightheadedness  Palpitations are also quiesced sent on the atenolol.  We will continue her current medications  We spent more than 50% of our >25 min visit in face to face counseling regarding the above

## 2017-12-18 ENCOUNTER — Ambulatory Visit: Payer: Medicare HMO | Admitting: Rehabilitative and Restorative Service Providers"

## 2017-12-29 ENCOUNTER — Ambulatory Visit: Payer: Medicare HMO

## 2017-12-31 ENCOUNTER — Other Ambulatory Visit: Payer: Self-pay

## 2017-12-31 ENCOUNTER — Ambulatory Visit: Payer: Medicare HMO | Attending: Physician Assistant | Admitting: Rehabilitative and Restorative Service Providers"

## 2017-12-31 DIAGNOSIS — R2681 Unsteadiness on feet: Secondary | ICD-10-CM | POA: Insufficient documentation

## 2017-12-31 DIAGNOSIS — R42 Dizziness and giddiness: Secondary | ICD-10-CM | POA: Diagnosis present

## 2017-12-31 DIAGNOSIS — R2689 Other abnormalities of gait and mobility: Secondary | ICD-10-CM | POA: Diagnosis present

## 2017-12-31 NOTE — Patient Instructions (Signed)
[email or web address]  URL: [email or web address]  Date: 12/31/2017  Prepared by: Margretta Dittyhristina Gust Eugene   Exercises Standing Gaze Stabilization with Head Nod - 3x daily - 7x weekly Standing Gaze Stabilization with Head Rotation - 3x daily - 7x weekly

## 2017-12-31 NOTE — Therapy (Signed)
[facility] [address] [place], [place], [address] Phone: [phone]   Fax:  [phone]  Physical Therapy Evaluation  Patient Details  Name: Stephanie Odom MRN: [id number] Date of Birth: 06-20-1946 Referring Provider: Elder Negus, PA   Encounter Date: 12/31/2017  PT End of Session - 12/31/17 1227    Visit Number  1    Number of Visits  5    Date for PT Re-Evaluation  03/01/18    Authorization Type  [employer] $40 copay    PT Start Time  0915    PT Stop Time  1010    PT Time Calculation (min)  55 min    Activity Tolerance  Patient tolerated treatment well    Behavior During Therapy  [facility] for tasks assessed/performed       Past Medical History:  Diagnosis Date  . Chest pain    history of  . DM (diabetes mellitus) (HCC)   . Elevated transaminase level    Being followed by PCP  . HTN (hypertension)   . Sinus bradycardia     No past surgical history on file.  There were no vitals filed for this visit.   Subjective Assessment - 12/31/17 0919    Subjective  The patient reports "I just get plum sick", noting she has to hold the walls to walk and can't move her head.  She reports symptoms began with a sinus infection, then allergies, and then dizziness.  She reports a sudden onset of symptoms after bending to stack cardboard.  She describes symptoms as spinning, lightheaded, headache, sickness.   She reports symptoms worsened this past Saturday and Sunday she didn't get out of bed all day.   The patient notes she has had intermittent vertigo x 4 years (sudden onset when at the beach on vacation).  "I feel better today than I have in 6 days."  Symptoms usually last constant x 2-3 days.    Pertinent History  h/o cateracts removed (bilaterally- done recently with most recent completed 2 weeks ago), she reports h/o migraines (none recently), h/o diabetes, essential HTN, hyperlipidemia, RA, h/o neck surgery  (something growing on my spine), h/o R foot surgery.    Patient Stated Goals  Get rid of my vertigo.     Currently in Pain?  Yes    Pain Score  3     Pain Location  Head    Pain Orientation  Posterior normally across the forehead, but in the back of her head today    Pain Descriptors / Indicators  Aching;Headache    Pain Type  Chronic pain    Pain Onset  More than a month ago    Pain Frequency  Intermittent    Aggravating Factors   worse after sinus infection    Pain Relieving Factors  medicine (OTC)         [facility] PT Assessment - 12/31/17 0927      Assessment   Medical Diagnosis  Vertigo    Referring Provider  Elder Negus, PA    Onset Date/Surgical Date  11/27/17    Prior Therapy  none      Precautions   Precautions  Fall    Precaution Comments  holding walls to walk      Balance Screen   Has the patient fallen in the past 6 months  No    Has the patient had a decrease in activity level because of a fear of falling?  Yes    Is the patient reluctant to leave their home because of a fear of falling?   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  Screened prior ot vestibular exam due to h/o RA.       Ambulation/Gait   Ambulation/Gait  Yes    Ambulation/Gait Assistance  7: Independent    Ambulation Distance (Feet)  150 Feet    Assistive device  None    Gait Pattern  Within  Functional Limits    Ambulation Surface  Level    Gait velocity  3.0 ft/sec      High Level Balance   High Level Balance Comments  Standing eyes open + feet together =  30 seconds/ eyes closed 30 seconds.  Standing on foam feet together with eyes open=30 seconds/ eyes closed=12 seconds           Vestibular Assessment - 12/31/17 0938      Vestibular Assessment   General Observation  0/10 dizziness at rest, walks into clinic without a device indep at slowed pace      Symptom Behavior   Type of Dizziness  Lightheadedness spinning, "makes me sick"    Frequency of Dizziness  Intermittent, estimates 3-4 days/week    Duration of Dizziness  days    Aggravating Factors  Turning body quickly;Turning head quickly;Activity in general;Looking up to the ceiling;Forward bending    Relieving Factors  Head stationary      Occulomotor Exam   Occulomotor Alignment  Abnormal R eye mild hypertropia noted    Spontaneous  Absent    Gaze-induced  Absent    Smooth Pursuits  Intact    Saccades  Intact      Vestibulo-Occular Reflex   VOR 1 Head Only (x 1 viewing)  Head impulse test=positive bilaterally for head impulse test    VOR to Slow Head Movement  Positive right cannot maintain gaze at slow pace to Right      Positional Testing   Sidelying Test  Sidelying Right;Sidelying Left    Horizontal Canal Testing  Horizontal Canal Right;Horizontal Canal Left      Sidelying Right   Sidelying Right Duration  "pressure", not spinning x seconds    Sidelying Right Symptoms  No nystagmus viewed in room light      Sidelying Left   Sidelying Left Duration  mild sensation of "pressure"    Sidelying Left Symptoms  No nystagmus      Horizontal Canal Right   Horizontal Canal Right Duration  none    Horizontal Canal Right Symptoms  Normal      Horizontal Canal Left   Horizontal  Canal Left Duration  mild sensation, no nystagmus viewed in room light    Horizontal Canal Left Symptoms  Normal       Orthostatics   BP supine (x 5 minutes)  157/87 took BP meds last night    HR supine (x 5 minutes)  70    BP standing (after 1 minute)  145/78    HR standing (after 1 minute)  78    BP standing (after 3 minutes)  155/85    HR standing (after 3 minutes)  75    Orthostatics Comment  Patient experienced lightheadedness and posterior pulling sensation upon rising from supine>standing.           Objective measurements completed on examination: See above findings.       Vestibular Treatment/Exercise - 12/31/17 0951      Vestibular Treatment/Exercise   Vestibular Treatment Provided  Gaze    Habituation Exercises  --    Gaze Exercises  X1 Viewing Horizontal;X1 Viewing Vertical      X1 Viewing Horizontal   Foot Position  standing with feet apart    Comments  30 seconds wiht 5/10 dizziness at end of 30 seconds      X1 Viewing Vertical   Foot Position  standing with feet apart    Comments  30 seconds            PT Education - 12/31/17 1226    Education Details  HEP: VOR x 1 viewing in horizontal and vertical plane    Person(s) Educated  Patient    Methods  Explanation;Demonstration;Handout    Comprehension  Verbalized understanding;Returned demonstration       PT Short Term Goals - 12/31/17 1231      PT SHORT TERM GOAL #1   Title  The patient will be indep with HEP for gaze adaptation, walking program, habituation and balance.    Time  4    Period  Weeks    Target Date  01/30/18      PT SHORT TERM GOAL #2   Title  The patient will tolerate gaze x 1 viewing x 60 seconds with dizziness < or equal to 3/10    Baseline  30 seconds of VOR leads to 5/10 dizziness    Time  4    Period  Weeks    Target Date  01/30/18      PT SHORT TERM GOAL #3   Title  The patient will maintain foam standing with eyes closed x 30 seconds to demo improved use of vestibular inputs for balance.    Time  4    Period  Weeks    Target Date  01/30/18        PT Long Term Goals - 12/31/17  1239      PT LONG TERM GOAL #1   Title  The patient will improve functional status score from 45% to > or equal to 55%.    Time  8    Period  Weeks    Target Date  03/01/18      PT LONG TERM GOAL #2   Title  The patient will be indep with progression of HEP for post d/c activities.    Time  8    Period  Weeks    Target Date  03/01/18      PT LONG TERM GOAL #3   Title  The patient will improve gait speed to > or equal to 3.5 ft/sec.    Time  8  Period  Weeks    Target Date  03/01/18             Plan - 12/31/17 1249    Clinical Impression Statement  The patient is a 72 year old female presenting to OP rehab with intermittent vertigo with impaired VOR, decreased high level balance and decline in functional mobility due to dizziness.  Patient will benefit from VOR training, balance activities, habituation, and general mobility to optimize current functional status.     History and Personal Factors relevant to plan of care:  h/o intermittent vertigo    Clinical Presentation  Stable    Clinical Presentation due to:  improving this week    Clinical Decision Making  Low    Rehab Potential  Good    PT Frequency  -- eval + 1x every 2 weeks (due to work schedule) x 8 weeks    PT Treatment/Interventions  ADLs/Self Care Home Management;Therapeutic exercise;Balance training;Neuromuscular re-education;Vestibular;Gait training;Functional mobility training;Therapeutic activities;Patient/family education    PT Next Visit Plan  Check VOR, perform habituation for bending, turns, etc.  Add compliant surfaces with eyes closed to HEP    Consulted and Agree with Plan of Care  Patient       Patient will benefit from skilled therapeutic intervention in order to improve the following deficits and impairments:  Decreased activity tolerance, Decreased balance, Dizziness, Difficulty walking  Visit Diagnosis: Other abnormalities of gait and mobility  Dizziness and giddiness  Unsteadiness on  feet     Problem List Patient Active Problem List   Diagnosis Date Noted  . Anxiety/stress 11/13/2011  . Chest pain-atypical 11/11/2010  . DM 10/25/2009  . HYPERTENSION, BENIGN 10/13/2008  . SINUS TACHYCARDIA 10/13/2008    Tangee Marszalek, PT 12/31/2017, 12:55 PM  [facility] [address] [place], [place], [address] Phone: [phone]   Fax:  [phone]  Name: Stephanie Odom MRN: [id number] Date of Birth: August 04, 1945

## 2018-01-13 ENCOUNTER — Ambulatory Visit: Payer: Medicare HMO

## 2018-01-27 ENCOUNTER — Encounter: Payer: Medicare HMO | Admitting: Rehabilitative and Restorative Service Providers"

## 2018-04-26 ENCOUNTER — Encounter: Payer: Self-pay | Admitting: Rehabilitative and Restorative Service Providers"

## 2018-04-26 NOTE — Therapy (Signed)
[facility] [address] [place], [place], [address] Phone: [phone]   Fax:  [phone]  Patient Details  Name: Stephanie Odom MRN: [id number] Date of Birth: 06/26/1946 Referring Provider:  Candi Leash, PA   Encounter Date: last encou nter 12/31/2017  PHYSICAL THERAPY DISCHARGE SUMMARY  Visits from Start of Care: eval only  Current functional level related to goals / functional outcomes: See initial summary   Remaining deficits: See initial summary   Education / Equipment: See eval  Plan: Patient agrees to discharge.  Patient goals were not met. Patient is being discharged due to not returning since the last visit.  ?????     Patient called to cancel visit due to travel and did not reschedule.  Thank you for the referral of this patient. Rudell Cobb, MPT    Ellijah Leffel 04/26/2018, 1:06 PM  [facility] [address] [place], [place], [address] Phone: [phone]   Fax:  [phone]

## 2019-03-28 NOTE — Progress Notes (Signed)
Cardiology Office Note Date:  03/29/2019  Patient ID:  Stephanie Odom, DOB 03/21/1946, MRN [id number] PCP:  Elder NegusSanders, David, PA-C  Electrophysiologist:  Dr. Graciela HusbandsKlein    Chief Complaint:  annual visit  History of Present Illness: Stephanie Odom is a 73 y.o. female with history of DM, HTN, palpitations/sinus tachycardia.  She comes in today to be seen for Dr. Graciela HusbandsKlein, last seen by him 09/2017.  AT that visit he mentions "She had been seen previously for sinus tachycardia Treated with low dose beta blockers. She also takes the vagolytic amitriptyline".   Pt mentioned that she had some dizziness after her medicines, though BP was elevated, suggested she take her lisinopril in the evenings.  Outside of her vertigo, she is doing well.  She mentions a slight pain in her chest with certain foods/drinks, this dates back many years and is unchanged.  No exertional intolerances, no CP, palpitations.  She has asthma and occassionally this flares up, no SOB otherwise, no DOE.  NO symptoms of PND or orthopnea.  No near syncope or syncope  She had vertigo that can be debilitating, has a specialist and goes for therapy.  Meclizine did not help her and has not taken it in quite a while   She sees her PMD every 3 months, reports labs are done routinely there    Past Medical History:  Diagnosis Date  . Chest pain    history of  . DM (diabetes mellitus) (HCC)   . Elevated transaminase level    Being followed by PCP  . HTN (hypertension)   . Sinus bradycardia     No past surgical history on file.  Current Outpatient Medications  Medication Sig Dispense Refill  . atenolol (TENORMIN) 25 MG tablet Take 1 tablet (25 mg total) by mouth daily. NEED APPOINTMENT FOR FUTURE REFILLS OR 90-DAY SUPPLY. 30 tablet 0  . lisinopril (PRINIVIL,ZESTRIL) 10 MG tablet Take 1 tablet by mouth daily.    Marland Kitchen. LORazepam (ATIVAN) 1 MG tablet Take 1 tablet by mouth daily as needed for sleep.    . meclizine (ANTIVERT) 25 MG tablet  Take 25 mg by mouth. 4 TIMES DAILY    . metFORMIN (GLUMETZA) 500 MG (MOD) 24 hr tablet Take 1,000 mg by mouth 2 (two) times daily with a meal.     . Multiple Vitamin (MULTIVITAMIN) capsule Take 1 capsule by mouth daily.      . Omega-3 Fatty Acids (FISH OIL) 1200 MG CAPS Take 2 capsules by mouth daily.      . ranitidine (ZANTAC) 150 MG tablet Take 150 mg by mouth as needed.      . simvastatin (ZOCOR) 40 MG tablet Take 1 tablet (40 mg total) by mouth every evening. 30 tablet 11  . vitamin C (ASCORBIC ACID) 500 MG tablet Take 500 mg by mouth daily.    . vitamin E (VITAMIN E) 400 UNIT capsule Take 400 Units by mouth daily.       No current facility-administered medications for this visit.     Allergies:   Zostavax [zoster vaccine live]   Social History:  The patient  reports that she has quit smoking. She has never used smokeless tobacco. She reports that she does not drink alcohol or use drugs.   Family History:  The patient's family history includes Asthma in her unknown relative; Diabetes in her unknown relative; GER disease in her unknown relative; Hypertension in her unknown relative; Other in her unknown relative.  ROS:  Please  see the history of present illness.    All other systems are reviewed and otherwise negative.   PHYSICAL EXAM:  VS:  BP (!) 142/86   Pulse 78   Ht 5' 1.5" (1.562 m)   Wt 138 lb (62.6 kg)   BMI 25.65 kg/m  BMI: Body mass index is 25.65 kg/m. Well nourished, well developed, in no acute distress  HEENT: normocephalic, atraumatic  Neck: no JVD, carotid bruits or masses Cardiac:  RRR; no significant murmurs, no rubs, or gallops Lungs:  CTA b/l, no wheezing, rhonchi or rales  Abd: soft, nontender MS: no deformity or atrophy Ext:  no edema  Skin: warm and dry, no rash Neuro:  No gross deficits appreciated Psych: euthymic mood, full affect    EKG:  Done today and reviewed by myself shows  SR, 78bpm, normal EKG   07/14/16: TTE Study Conclusions - Left  ventricle: The cavity size was normal. Wall thickness was   increased in a pattern of mild LVH. Systolic function was normal.   The estimated ejection fraction was in the range of 55% to 60%.   Wall motion was normal; there were no regional wall motion   abnormalities. Doppler parameters are consistent with abnormal   left ventricular relaxation (grade 1 diastolic dysfunction). The   E/e&' ratio is between 8-15, suggesting indeterminate LV filling   pressure. - Aortic valve: Trileaflet. Sclerosis without stenosis. There was   no regurgitation. - Mitral valve: Calcified annulus. Mildly thickened leaflets .   There was mild regurgitation. - Left atrium: The atrium was normal in size. - Right atrium: The atrium was normal in size. - Tricuspid valve: There was trivial regurgitation. - Pulmonary arteries: PA peak pressure: 22 mm Hg (S). - Inferior vena cava: The vessel was normal in size. The   respirophasic diameter changes were in the normal range (>= 50%),   consistent with normal central venous pressure.  Impressions:  - LVEF 55-60%, mild LVH, normal wall motion, diastolic dysfunction,   indeterminate LV filling pressure, mild MR, normal biatrial size,   trivial TR, normal RVSP, normal IVC, no pericardial effusion.   Recent Labs: No results found for requested labs within last 8760 hours.  No results found for requested labs within last 8760 hours.   CrCl cannot be calculated (No successful lab value found.).   Wt Readings from Last 3 Encounters:  03/29/19 138 lb (62.6 kg)  10/19/17 142 lb (64.4 kg)  04/12/13 148 lb (67.1 kg)     Other studies reviewed: Additional studies/records reviewed today include: summarized above  ASSESSMENT AND PLAN:  1. Palpitations, sinus tach     Not an ongoing complaint, reports the atenolol has done very well for her  2. HTN     A recheck is 138/68, she says unusual for her to be high, rarely drives in the city and is stressful for her   Disposition: F/u with Dr. Berenice Bouton in one year, sooner if needed  Current medicines are reviewed at length with the patient today.  The patient did not have any concerns regarding medicines.  Venetia Night, PA-C 03/29/2019 2:29 PM     [place] [address] [phone] (office)  [phone] (fax)

## 2019-03-29 ENCOUNTER — Ambulatory Visit (INDEPENDENT_AMBULATORY_CARE_PROVIDER_SITE_OTHER): Payer: Medicare Other | Admitting: Physician Assistant

## 2019-03-29 ENCOUNTER — Other Ambulatory Visit: Payer: Self-pay

## 2019-03-29 VITALS — BP 142/86 | HR 78 | Ht 61.5 in | Wt 138.0 lb

## 2019-03-29 DIAGNOSIS — I10 Essential (primary) hypertension: Secondary | ICD-10-CM | POA: Diagnosis not present

## 2019-03-29 DIAGNOSIS — I479 Paroxysmal tachycardia, unspecified: Secondary | ICD-10-CM

## 2019-03-29 NOTE — Patient Instructions (Signed)
Medication Instructions:  Your physician recommends that you continue on your current medications as directed. Please refer to the Current Medication list given to you today.   If you need a refill on your cardiac medications before your next appointment, please call your pharmacy.   Lab work: NONE ORDERED  TODAY   If you have labs (blood work) drawn today and your tests are completely normal, you will receive your results only by: . MyChart Message (if you have MyChart) OR . A paper copy in the mail If you have any lab test that is abnormal or we need to change your treatment, we will call you to review the results.  Testing/Procedures: NONE ORDERED  TODAY    Follow-Up: At [facility], you and your health needs are our priority.  As part of our continuing mission to provide you with exceptional heart care, we have created designated Provider Care Teams.  These Care Teams include your primary Cardiologist (physician) and Advanced Practice Providers (APPs -  Physician Assistants and Nurse Practitioners) who all work together to provide you with the care you need, when you need it. You will need a follow up appointment in 1 years.  Please call our office 2 months in advance to schedule this appointment.  You may see  Dr. Klein or one of the following Advanced Practice Providers on your designated Care Team:   Amber Seiler, NP . Renee Ursuy, PA-C . Andrew Tillery PA-C  Any Other Special Instructions Will Be Listed Below (If Applicable).     

## 2020-05-21 DIAGNOSIS — R002 Palpitations: Secondary | ICD-10-CM | POA: Insufficient documentation

## 2020-05-23 ENCOUNTER — Other Ambulatory Visit: Payer: Self-pay

## 2020-05-23 ENCOUNTER — Ambulatory Visit: Payer: Medicare Other | Admitting: Internal Medicine

## 2020-05-23 ENCOUNTER — Encounter: Payer: Self-pay | Admitting: Internal Medicine

## 2020-05-23 DIAGNOSIS — R002 Palpitations: Secondary | ICD-10-CM | POA: Diagnosis not present

## 2020-05-23 NOTE — Patient Instructions (Signed)

## 2020-05-23 NOTE — Progress Notes (Signed)
      Patient Care Team: Elder Negus, PA-C as PCP - General (Physician Assistant)   HPI  Stephanie Odom is a 74 y.o. female seen in followup for palpitations assoc with sinus tachy, treated with low dose beta blockers.  She also takes the vagolytic amitriptyline  The patient denies shortness of breath, nocturnal dyspnea, orthopnea or peripheral edema.  There have been no palpitations, lightheadedness or syncope.   Feels great  Some chest pain, never at work but when she gets home.  And wonders whether it is fussing with her husband       DATE TEST EF   2009 Myoview  65 %   12/17 Echo   55-65 %   1/18 CPX  No clear limitations      Records and Results Reviewed   Past Medical History:  Diagnosis Date  . Chest pain    history of  . DM (diabetes mellitus) (HCC)   . Elevated transaminase level    Being followed by PCP  . HTN (hypertension)   . Sinus bradycardia     No past surgical history on file.  Current Outpatient Medications  Medication Sig Dispense Refill  . atenolol (TENORMIN) 25 MG tablet Take 1 tablet (25 mg total) by mouth daily. NEED APPOINTMENT FOR FUTURE REFILLS OR 90-DAY SUPPLY. 30 tablet 0  . lisinopril (PRINIVIL,ZESTRIL) 10 MG tablet Take 1 tablet by mouth daily.    Marland Kitchen LORazepam (ATIVAN) 1 MG tablet Take 1 tablet by mouth daily as needed for sleep.    . metFORMIN (GLUMETZA) 500 MG (MOD) 24 hr tablet Take 1,000 mg by mouth 2 (two) times daily with a meal.     . Multiple Vitamin (MULTIVITAMIN) capsule Take 1 capsule by mouth daily.      . Omega-3 Fatty Acids (FISH OIL) 1200 MG CAPS Take 2 capsules by mouth daily.      . ranitidine (ZANTAC) 150 MG tablet Take 150 mg by mouth as needed.      . simvastatin (ZOCOR) 40 MG tablet Take 1 tablet (40 mg total) by mouth every evening. 30 tablet 11  . vitamin C (ASCORBIC ACID) 500 MG tablet Take 500 mg by mouth daily.    . vitamin E (VITAMIN E) 400 UNIT capsule Take 400 Units by mouth daily.       No current  facility-administered medications for this visit.    Allergies  Allergen Reactions  . Zostavax [Zoster Vaccine Live] Other (See Comments)    Pt states she received the shingles vaccine and broke out in a rash      Review of Systems negative except from HPI and PMH  Physical Exam BP (!) 160/80   Pulse 74   Wt 132 lb (59.9 kg)   SpO2 98%   BMI 24.54 kg/m  Well developed and nourished in no acute distress HENT normal Neck supple  Clear Regular rate and rhythm, no murmurs or gallops Abd-soft with active BS No Clubbing cyanosis edema Skin-warm and dry A & Oriented  Grossly normal sensory and motor function  ECG sinus @ 74 13/08/39   Assessment and  Plan  Palpitations-- sinus tach  Hypertension  Dyspnea on exertion    Palps are quiet  BP elevated but will check at home-- mostly 130s' in the past  No complaints -- still working 70+hrs/2 weeks

## 2021-05-27 ENCOUNTER — Ambulatory Visit: Payer: Medicare Other | Admitting: Physician Assistant

## 2021-07-07 NOTE — Progress Notes (Signed)
Cardiology Office Note Date:  07/07/2021  Patient ID:  Stephanie Odom, Stephanie Odom Jan 25, 1946, MRN [id number] PCP:  Elder Negus, PA-C  Electrophysiologist:  Dr. Graciela Husbands    Chief Complaint:   annual visit  History of Present Illness: Stephanie Odom is a 75 y.o. female with history of DM, HTN, palpitations/sinus tachycardia.  She saw Dr. Graciela Husbands, last seen by him 09/2017.  AT that visit he mentions "She had been seen previously for sinus tachycardia  Treated with low dose beta blockers.  She also takes the vagolytic amitriptyline".   Pt mentioned that she had some dizziness after her medicines, though BP was elevated, suggested she take her lisinopril in the evenings.   I saw her 03/29/2019 Outside of her vertigo, she is doing well.  She mentions a slight pain in her chest with certain foods/drinks, this dates back many years and is unchanged.  No exertional intolerances, no CP, palpitations.  She has asthma and occassionally this flares up, no SOB otherwise, no DOE.  NO symptoms of PND or orthopnea.  No near syncope or syncope She had vertigo that can be debilitating, has a specialist and goes for therapy.  Meclizine did not help her and has not taken it in quite a while  She sees her PMD every 3 months, reports labs are done routinely there No changes were made, planned for annual visits   She saw Dr. Graciela Husbands Oct 2021, still working nearly full time, no changes were made.  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 She has no symptoms at work or with exercise Though in the evenings she gets some central chest pressure, she thinks this is associated with stress. Mentions her husband is verbally quite hard to live with and is very stressful for her She has excellent support with her 3 kids, 2 sisters and close friend, all who have offered and urged her to leave and offered housing for her, but she does not yet want to leave the house.  The pressure is not  radiating, no associated symptoms, not every night. She has a L neck/shoulder pain that is worsened with some mobements, not associated with her CP  No near syncoe or syncope No SOB  Past Medical History:  Diagnosis Date   Chest pain    history of   DM (diabetes mellitus) (HCC)    Elevated transaminase level    Being followed by PCP   HTN (hypertension)    Sinus bradycardia     No past surgical history on file.  Current Outpatient Medications  Medication Sig Dispense Refill   atenolol (TENORMIN) 25 MG tablet Take 1 tablet (25 mg total) by mouth daily. NEED APPOINTMENT FOR FUTURE REFILLS OR 90-DAY SUPPLY. 30 tablet 0   lisinopril (PRINIVIL,ZESTRIL) 10 MG tablet Take 1 tablet by mouth daily.     LORazepam (ATIVAN) 1 MG tablet Take 1 tablet by mouth daily as needed for sleep.     metFORMIN (GLUMETZA) 500 MG (MOD) 24 hr tablet Take 1,000 mg by mouth 2 (two) times daily with a meal.      Multiple Vitamin (MULTIVITAMIN) capsule Take 1 capsule by mouth daily.       Omega-3 Fatty Acids (FISH OIL) 1200 MG CAPS Take 2 capsules by mouth daily.       ranitidine (ZANTAC) 150 MG tablet Take 150 mg by mouth as needed.       simvastatin (ZOCOR) 40 MG tablet Take 1 tablet (  40 mg total) by mouth every evening. 30 tablet 11   vitamin C (ASCORBIC ACID) 500 MG tablet Take 500 mg by mouth daily.     vitamin E (VITAMIN E) 400 UNIT capsule Take 400 Units by mouth daily.       No current facility-administered medications for this visit.    Allergies:   Zostavax [zoster vaccine live]   Social History:  The patient  reports that she has quit smoking. She has never used smokeless tobacco. She reports that she does not drink alcohol and does not use drugs.   Family History:  The patient's family history includes Asthma in her unknown relative; Diabetes in her unknown relative; GER disease in her unknown relative; Hypertension in her unknown relative; Other in her unknown relative.  ROS:  Please see the  history of present illness.    All other systems are reviewed and otherwise negative.   PHYSICAL EXAM:  VS:  There were no vitals taken for this visit. BMI: There is no height or weight on file to calculate BMI. Well nourished, well developed, in no acute distress  HEENT: normocephalic, atraumatic  Neck: no JVD, carotid bruits or masses Cardiac:  RRR; no significant murmurs, no rubs, or gallops Lungs:  CTA b/l, no wheezing, rhonchi or rales  Abd: soft, nontender MS: no deformity or atrophy Ext:  no edema  Skin: warm and dry, no rash Neuro:  No gross deficits appreciated Psych: euthymic mood, full affect    EKG:  Done today and reviewed by myself shows  SR 64bpm, no ischemic changes   07/14/16: TTE Study Conclusions - Left ventricle: The cavity size was normal. Wall thickness was   increased in a pattern of mild LVH. Systolic function was normal.   The estimated ejection fraction was in the range of 55% to 60%.   Wall motion was normal; there were no regional wall motion   abnormalities. Doppler parameters are consistent with abnormal   left ventricular relaxation (grade 1 diastolic dysfunction). The   E/e&' ratio is between 8-15, suggesting indeterminate LV filling   pressure. - Aortic valve: Trileaflet. Sclerosis without stenosis. There was   no regurgitation. - Mitral valve: Calcified annulus. Mildly thickened leaflets .   There was mild regurgitation. - Left atrium: The atrium was normal in size. - Right atrium: The atrium was normal in size. - Tricuspid valve: There was trivial regurgitation. - Pulmonary arteries: PA peak pressure: 22 mm Hg (S). - Inferior vena cava: The vessel was normal in size. The   respirophasic diameter changes were in the normal range (>= 50%),   consistent with normal central venous pressure.   Impressions:   - LVEF 55-60%, mild LVH, normal wall motion, diastolic dysfunction,   indeterminate LV filling pressure, mild MR, normal biatrial  size,   trivial TR, normal RVSP, normal IVC, no pericardial effusion.    Recent Labs: No results found for requested labs within last 8760 hours.  No results found for requested labs within last 8760 hours.   CrCl cannot be calculated (No successful lab value found.).   Wt Readings from Last 3 Encounters:  05/23/20 132 lb (59.9 kg)  03/29/19 138 lb (62.6 kg)  10/19/17 142 lb (64.4 kg)     Other studies reviewed: Additional studies/records reviewed today include: summarized above  ASSESSMENT AND PLAN:  1. Palpitations, sinus tach     Not an ongoing complaint, reports the atenolol has done very well for her  2. HTN  Looks good  3. CP Somewhat atypical though given her age, think is reasonable to work up Plan for coronary CTa  We discussed at length her home situation, while she thinks her husband is "crazy" and living with him is awful, she is not ready to leave, all of her kids, sisters and a friend have offered her a place to stay and support, she says she can leave, she is just not ready to go.    Disposition: pending CT results, otherwise 71mo, sooner if needed  Current medicines are reviewed at length with the patient today.  The patient did not have any concerns regarding medicines.  Norma Fredrickson, PA-C 07/07/2021 12:27 PM     [facility] [address] [place] [address] [phone] (office)  [phone] (fax)

## 2021-07-09 ENCOUNTER — Ambulatory Visit: Payer: Medicare Other | Admitting: Physician Assistant

## 2021-07-09 ENCOUNTER — Encounter: Payer: Self-pay | Admitting: Physician Assistant

## 2021-07-09 ENCOUNTER — Other Ambulatory Visit: Payer: Self-pay

## 2021-07-09 VITALS — BP 122/72 | HR 64 | Ht 62.5 in | Wt 125.8 lb

## 2021-07-09 DIAGNOSIS — R079 Chest pain, unspecified: Secondary | ICD-10-CM | POA: Diagnosis not present

## 2021-07-09 DIAGNOSIS — I10 Essential (primary) hypertension: Secondary | ICD-10-CM

## 2021-07-09 DIAGNOSIS — R000 Tachycardia, unspecified: Secondary | ICD-10-CM

## 2021-07-09 DIAGNOSIS — R002 Palpitations: Secondary | ICD-10-CM

## 2021-07-09 LAB — BASIC METABOLIC PANEL
BUN/Creatinine Ratio: 14 (ref 12–28)
BUN: 8 mg/dL (ref 8–27)
CO2: 25 mmol/L (ref 20–29)
Calcium: 10.2 mg/dL (ref 8.7–10.3)
Chloride: 92 mmol/L — ABNORMAL LOW (ref 96–106)
Creatinine, Ser: 0.59 mg/dL (ref 0.57–1.00)
Glucose: 109 mg/dL — ABNORMAL HIGH (ref 70–99)
Potassium: 4.5 mmol/L (ref 3.5–5.2)
Sodium: 134 mmol/L (ref 134–144)
eGFR: 93 mL/min/{1.73_m2} (ref >59)

## 2021-07-09 NOTE — Patient Instructions (Addendum)
Medication Instructions:   Your physician recommends that you continue on your current medications as directed. Please refer to the Current Medication list given to you today.   *If you need a refill on your cardiac medications before your next appointment, please call your pharmacy*   Lab Work: BMET TODAY   If you have labs (blood work) drawn today and your tests are completely normal, you will receive your results only by: MyChart Message (if you have MyChart) OR A paper copy in the mail If you have any lab test that is abnormal or we need to change your treatment, we will call you to review the results.   Testing/Procedures: Non-Cardiac CT Angiography (CTA), is a special type of CT scan that uses a computer to produce multi-dimensional views of major blood vessels throughout the body. In CT angiography, a contrast material is injected through an IV to help visualize the blood vessels    Follow-Up: At [facility], you and your health needs are our priority.  As part of our continuing mission to provide you with exceptional heart care, we have created designated Provider Care Teams.  These Care Teams include your primary Cardiologist (physician) and Advanced Practice Providers (APPs -  Physician Assistants and Nurse Practitioners) who all work together to provide you with the care you need, when you need it.  We recommend signing up for the patient portal called "MyChart".  Sign up information is provided on this After Visit Summary.  MyChart is used to connect with patients for Virtual Visits (Telemedicine).  Patients are able to view lab/test results, encounter notes, upcoming appointments, etc.  Non-urgent messages can be sent to your provider as well.   To learn more about what you can do with MyChart, go to [email or web address].    Your next appointment:   6 month(s)  The format for your next appointment:   In Person   Provider:   You may see  Dr. Graciela Husbands  or one of the  following Advanced Practice Providers on your designated Care Team:   Francis Dowse, [place] Casimiro Needle "Mardelle Matte" Lanna Poche, [place]    Other Instructions    Your cardiac CT will be scheduled at one of the below locations:   [facility] [address] [place], [place] [address] [phone]  OR  [facility] [address] [place], [place] [address] [phone]  If scheduled at [facility], please arrive at the [facility] main entrance (entrance A) of [facility] 30 minutes prior to test start time. You can use the FREE valet parking offered at the main entrance (encouraged to control the heart rate for the test) Proceed to the [facility] Radiology Department (first floor) to check-in and test prep.  If scheduled at [facility], please arrive 15 mins early for check-in and test prep.  Please follow these instructions carefully (unless otherwise directed):   On the Night Before the Test: Be sure to Drink plenty of water. Do not consume any caffeinated/decaffeinated beverages or chocolate 12 hours prior to your test. Do not take any antihistamines 12 hours prior to your test.  On the Day of the Test: Drink plenty of water until 1 hour prior to the test. Do not eat any food 4 hours prior to the test. You may take your regular medications prior to the test.  Take Atenolol (Tenormin) two hours prior to test. HOLD Furosemide/Hydrochlorothiazide morning of the test. FEMALES- please wear underwire-free  bra if available, avoid dresses & tight clothing  After the Test: Drink plenty of water. After receiving IV contrast, you may experience a mild flushed feeling. This is normal. On occasion, you may experience a mild rash up to 24 hours after the test. This is not dangerous. If this occurs, you can take Benadryl 25 mg and increase your fluid intake. If you experience trouble breathing, this can  be serious. If it is severe call 911 IMMEDIATELY. If it is mild, please call our office. If you take any of these medications: Glipizide/Metformin, Avandament, Glucavance, please do not take 48 hours after completing test unless otherwise instructed.  Please allow 2-4 weeks for scheduling of routine cardiac CTs. Some insurance companies require a pre-authorization which may delay scheduling of this test.   For non-scheduling related questions, please contact the cardiac imaging nurse navigator should you have any questions/concerns: Rockwell Alexandria, Cardiac Imaging Nurse Navigator Larey Brick, Cardiac Imaging Nurse Navigator [facility] Direct Office Dial: [phone]   For scheduling needs, including cancellations and rescheduling, please call [place], [phone].

## 2021-07-26 ENCOUNTER — Telehealth (HOSPITAL_COMMUNITY): Payer: Self-pay | Admitting: *Deleted

## 2021-07-26 NOTE — Telephone Encounter (Signed)
Reaching out to patient to offer assistance regarding upcoming cardiac imaging study; pt verbalizes understanding of appt date/time, parking situation and where to check in, pre-test NPO status and verified current allergies; name and call back number provided for further questions should they arise  Larey Brick RN Navigator Cardiac Imaging [facility] [phone] office [phone] cell.  Patient to take 25mg  atenolol two hours prior to her cardiac CT scan. She aware to arrive 11am for her 11:30am. Patient may have to cancel scan if her insurance does not come through.

## 2021-07-30 ENCOUNTER — Other Ambulatory Visit: Payer: Self-pay

## 2021-07-30 ENCOUNTER — Ambulatory Visit (HOSPITAL_COMMUNITY)
Admission: RE | Admit: 2021-07-30 | Discharge: 2021-07-30 | Disposition: A | Discharge status: Home / Self Care | Payer: Medicare Other | Source: Ambulatory Visit | Attending: Physician Assistant | Admitting: Physician Assistant

## 2021-07-30 ENCOUNTER — Encounter (HOSPITAL_COMMUNITY): Payer: Self-pay

## 2021-07-30 DIAGNOSIS — I2510 Atherosclerotic heart disease of native coronary artery without angina pectoris: Secondary | ICD-10-CM | POA: Insufficient documentation

## 2021-07-30 DIAGNOSIS — R079 Chest pain, unspecified: Secondary | ICD-10-CM | POA: Insufficient documentation

## 2021-07-30 MED ORDER — NITROGLYCERIN 0.4 MG SL SUBL
0.8000 mg | SUBLINGUAL_TABLET | Freq: Once | SUBLINGUAL | Status: AC
  Administered 2021-07-30: 0.8 mg via SUBLINGUAL

## 2021-07-30 MED ORDER — NITROGLYCERIN 0.4 MG SL SUBL
SUBLINGUAL_TABLET | SUBLINGUAL | Status: AC
  Filled 2021-07-30: qty 2

## 2021-07-30 MED ORDER — IOHEXOL 350 MG/ML SOLN
95.0000 mL | Freq: Once | INTRAVENOUS | Status: AC | PRN
  Administered 2021-07-30: 95 mL via INTRAVENOUS

## 2021-07-30 MED ORDER — METOPROLOL TARTRATE 5 MG/5ML IV SOLN
INTRAVENOUS | Status: AC
  Filled 2021-07-30: qty 10

## 2022-03-13 ENCOUNTER — Ambulatory Visit: Payer: Medicare Other | Admitting: Internal Medicine

## 2022-03-13 ENCOUNTER — Encounter: Payer: Self-pay | Admitting: Internal Medicine

## 2022-03-13 VITALS — BP 100/64 | HR 70 | Ht 62.5 in | Wt 126.0 lb

## 2022-03-13 DIAGNOSIS — R002 Palpitations: Secondary | ICD-10-CM

## 2022-03-13 DIAGNOSIS — R000 Tachycardia, unspecified: Secondary | ICD-10-CM | POA: Diagnosis not present

## 2022-03-13 NOTE — Progress Notes (Signed)
      Patient Care Team: Elder Negus, PA-C as PCP - General (Physician Assistant)   HPI  Stephanie Odom is a 76 y.o. female seen in followup for palpitations assoc with sinus tachy, treated with low dose beta blockers.  She also takes the vagolytic amitriptyline   The patient denies chest pain, shortness of breath, nocturnal dyspnea, orthopnea or peripheral edema.  There have been no palpitations, lightheadedness or syncope.     DATE TEST EF   2009 Myoview  65 %   12/17 Echo   55-65 %   1/18 CPX  No clear limitations   Date Cr K Hgb  12/22 0.59 4.5              Records and Results Reviewed   Past Medical History:  Diagnosis Date   Chest pain    history of   DM (diabetes mellitus) (HCC)    Elevated transaminase level    Being followed by PCP   HTN (hypertension)    Sinus bradycardia     History reviewed. No pertinent surgical history.  Current Outpatient Medications  Medication Sig Dispense Refill   atenolol (TENORMIN) 25 MG tablet Take 1 tablet (25 mg total) by mouth daily. NEED APPOINTMENT FOR FUTURE REFILLS OR 90-DAY SUPPLY. 30 tablet 0   lisinopril (ZESTRIL) 20 MG tablet Take 20 mg by mouth daily.     LORazepam (ATIVAN) 2 MG tablet Take 2 mg by mouth 2 (two) times daily.     metFORMIN (GLUMETZA) 500 MG (MOD) 24 hr tablet Take 1,000 mg by mouth 2 (two) times daily with a meal.      montelukast (SINGULAIR) 10 MG tablet Take 1 tablet by mouth daily.     Multiple Vitamin (MULTIVITAMIN) capsule Take 1 capsule by mouth daily.       Omega-3 Fatty Acids (FISH OIL) 1200 MG CAPS Take 2 capsules by mouth daily.     simvastatin (ZOCOR) 40 MG tablet Take 1 tablet (40 mg total) by mouth every evening. 30 tablet 11   vitamin C (ASCORBIC ACID) 500 MG tablet Take 500 mg by mouth daily.     vitamin E 180 MG (400 UNITS) capsule Take 400 Units by mouth daily.       No current facility-administered medications for this visit.    Allergies  Allergen Reactions   Zostavax  [Zoster Vaccine Live] Other (See Comments)    Pt states she received the shingles vaccine and broke out in a rash      Review of Systems negative except from HPI and PMH  Physical Exam BP 100/64   Pulse 70   Ht 5' 2.5" (1.588 m)   Wt 126 lb (57.2 kg)   SpO2 95%   BMI 22.68 kg/m  Well developed and nourished in no acute distress HENT normal Neck supple with JVP-  flat  Clear Regular rate and rhythm, no murmurs or gallops Abd-soft with active BS No Clubbing cyanosis edema Skin-warm and dry A & Oriented  Grossly normal sensory and motor function  ECG sinus at 70 Intervals 13/08/40 Otherwise normal   Assessment and  Plan  Palpitations-- sinus tach  Hypertension     Palp patients are quiescient.  We will continue the atenolol at 25 mg   Blood pressure is better.  On repeat it was a little bit higher.  We will continue the lisinopril at 20 and metoprolol.  Encouraged her to go to the [facility] and [place]

## 2022-03-13 NOTE — Patient Instructions (Signed)
Medication Instructions:  Your physician recommends that you continue on your current medications as directed. Please refer to the Current Medication list given to you today. Current  *If you need a refill on your cardiac medications before your next appointment, please call your pharmacy*   Lab Work: None ordered.  If you have labs (blood work) drawn today and your tests are completely normal, you will receive your results only by: MyChart Message (if you have MyChart) OR A paper copy in the mail If you have any lab test that is abnormal or we need to change your treatment, we will call you to review the results.   Testing/Procedures: None ordered.    Follow-Up: At [facility], you and your health needs are our priority.  As part of our continuing mission to provide you with exceptional heart care, we have created designated Provider Care Teams.  These Care Teams include your primary Cardiologist (physician) and Advanced Practice Providers (APPs -  Physician Assistants and Nurse Practitioners) who all work together to provide you with the care you need, when you need it.  We recommend signing up for the patient portal called "MyChart".  Sign up information is provided on this After Visit Summary.  MyChart is used to connect with patients for Virtual Visits (Telemedicine).  Patients are able to view lab/test results, encounter notes, upcoming appointments, etc.  Non-urgent messages can be sent to your provider as well.   To learn more about what you can do with MyChart, go to [email or web address].    Your next appointment:   12 months with Dr Graciela Husbands  Important Information About Sugar

## 2022-10-21 IMAGING — CT CT HEART MORP W/ CTA COR W/ SCORE W/ CA W/CM &/OR W/O CM
4 of 7 series · 8 of 20 positions shown, 9 images · non-contrast
Comparison: None.
COMPARISON: None.

Addendum:
EXAM:
OVER-READ INTERPRETATION  CT CHEST

The following report is an over-read performed by radiologist Dr.
Aurea Tho [REDACTED] on 07/30/2021. This over-read
does not include interpretation of cardiac or coronary anatomy or
pathology. The coronary CTA interpretation by the cardiologist is
attached.
CLINICAL DATA: 75 Year old White Female
Cardiac/Coronary  CTA
TECHNIQUE: The patient was scanned on a Phillips Force scanner.

[Series 6: best diast · axial · 0.39mm/px · z∈[+1167,+1200]mm · 2 of 246 slices shown, 3 images]
[im 82/246  vessel]
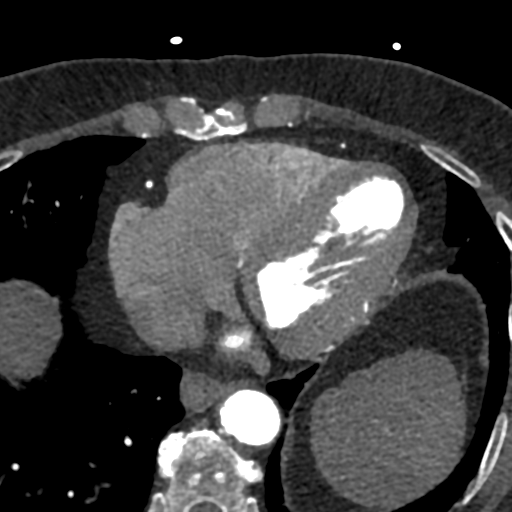
[im 82/246  lung]
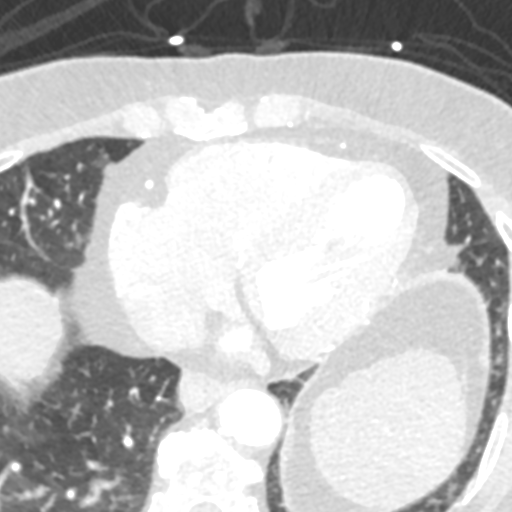
[im 164/246  vessel]
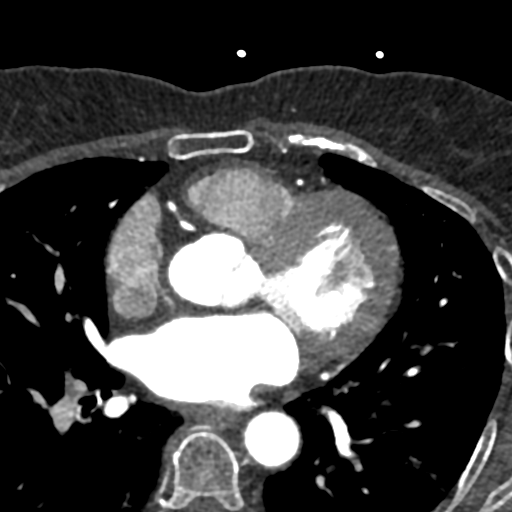

[Series 7: best syst · axial · 0.39mm/px · z∈[+1167,+1200]mm · 2 of 246 slices shown]
[im 82/246  vessel]
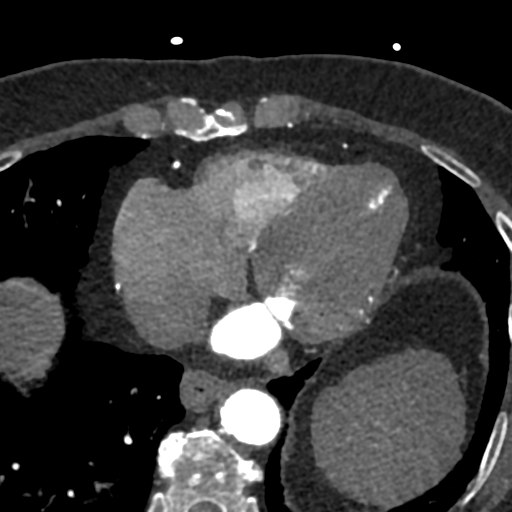
[im 164/246  vessel]
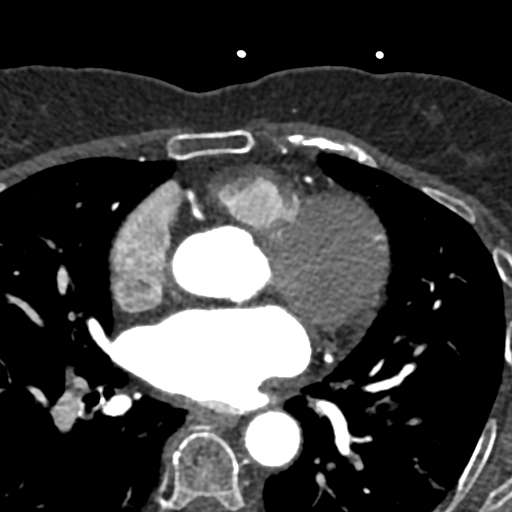

[Series 8: ts diast sharp · axial · 0.39mm/px · z∈[+1167,+1200]mm · 2 of 246 slices shown]
[im 82/246  lung]
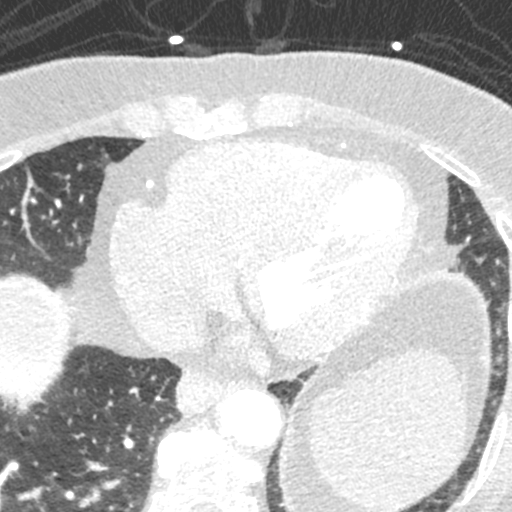
[im 164/246  lung]
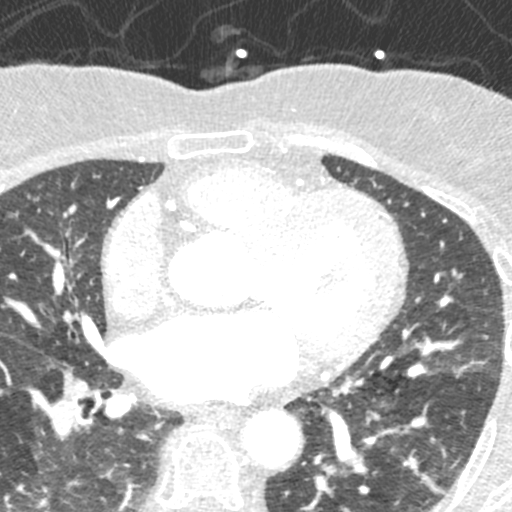

[Series 9: ts syst sharp · axial · 0.39mm/px · z∈[+1167,+1200]mm · 2 of 246 slices shown]
[im 82/246  lung]
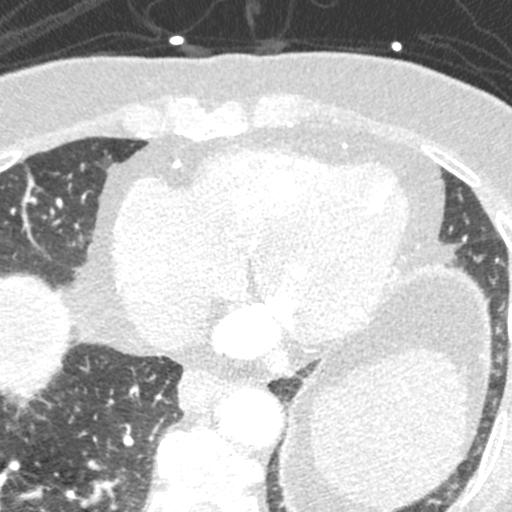
[im 164/246  lung]
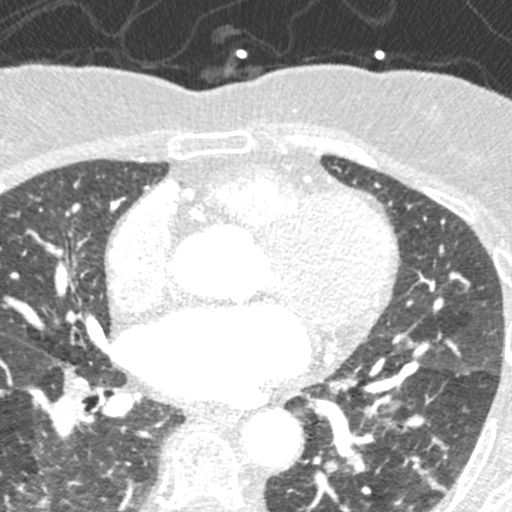

[8 of 20 positions shown; findings below may reference images not displayed]

FINDINGS: Vascular: Heart is normal size. Aorta normal caliber. Scattered
aortic calcifications.

Mediastinum/Nodes: No adenopathy

Lungs/Pleura: Areas of scarring in the lung bases. No confluent
opacities or effusions.

Upper Abdomen: No acute findings. Benign appearing calcification in
the a patent dome.

Musculoskeletal: Chest wall soft tissues are unremarkable. No acute
bony abnormality.
IMPRESSION: No acute extra cardiac abnormality.

Aortic atherosclerosis.
FINDINGS: Scan was triggered in the descending thoracic aorta. Axial
non-contrast 3 mm slices were carried out through the heart. The
data set was analyzed on a dedicated work station and scored using
the Agatson method. Gantry rotation speed was 250 msecs and
collimation was .6 mm. 0.8 mg of sl NTG was given. The 3D data set
was reconstructed in 5% intervals of the 67-82 % of the R-R cycle.
Diastolic phases were analyzed on a dedicated work station using
MPR, MIP and VRT modes. The patient received 95 cc of contrast.

Aorta:  Normal size.  No calcifications.  No dissection.

Main Pulmonary Artery: Normal size of the pulmonary artery.

Aortic Valve:  Tri-leaflet.  Aortic valve calcium score 282.

Coronary Arteries:  Normal coronary origin.  Right dominance.

Coronary Calcium Score:

Left main: 47

Left anterior descending artery: 120

Left circumflex artery: 70

Right coronary artery: 4

Total: 241

Percentile: 73rd for age, sex, and race matched control.

RCA is a large dominant artery that gives rise to PDA and PLA. There
is no significant plaque.

Left main is a large artery that gives rise to LAD and LCX arteries.
There is no significant plaque.

LAD is a large vessel that gives rise to two diagonal branches. Mild
calcified plaques in the mid vessel. Minimal calcified plaque in the
proximal D1.

LCX is a non-dominant artery that gives rise to one large OM1
branch. Minimal calcified plaque proximal and mid vessel.

Other findings:

Normal pulmonary vein drainage into the left atrium.

Normal left atrial appendage without a thrombus.

Normal variant interatrial vestigial remnant.

Extra-cardiac findings: See attached radiology report for
non-cardiac structures.

Body motion slab artifact noted.
IMPRESSION: 1. Coronary calcium score of 241. This was 73rd percentile for age,
sex, and race matched control.

2. Normal coronary origin with right dominance.

3. CAD-RADS 2. Mild non-obstructive CAD (25-49%). Consider
non-atherosclerotic causes of chest pain. Consider preventive
therapy and risk factor modification.

RECOMMENDATIONS:



If CAC = 0, it is reasonable to withhold statin therapy and reassess
in 5 to 10 years, as long as higher risk conditions are absent
(diabetes mellitus, family history of premature CHD in first degree
relatives (males <55 years; females <65 years), cigarette smoking,
LDL >=190 mg/dL or other independent risk factors).

If CAC is 1 to 99, it is reasonable to initiate statin therapy for
patients >=55 years of age.

If CAC is >=100 or >=75th percentile, it is reasonable to initiate
statin therapy at any age.

Cardiology referral should be considered for patients with CAC
scores =400 or >=75th percentile.

*5070 AHA/ACC/AACVPR/AAPA/ABC/KRETSCHMER/SHERRI/GERAINT JOHN/Frisel/NICKELSEN/TIGER/JANNIFER
Guideline on the Management of Blood Cholesterol: A Report of the
American College of Cardiology/American Heart Association Task Force
on Clinical Practice Guidelines. J Am Coll Cardiol.
1211;73(24):2858-2948.

*** End of Addendum ***
EXAM:
OVER-READ INTERPRETATION  CT CHEST

The following report is an over-read performed by radiologist Dr.
Aurea Tho [REDACTED] on 07/30/2021. This over-read
does not include interpretation of cardiac or coronary anatomy or
pathology. The coronary CTA interpretation by the cardiologist is
attached.
FINDINGS: Vascular: Heart is normal size. Aorta normal caliber. Scattered
aortic calcifications.

Mediastinum/Nodes: No adenopathy

Lungs/Pleura: Areas of scarring in the lung bases. No confluent
opacities or effusions.

Upper Abdomen: No acute findings. Benign appearing calcification in
the a patent dome.

Musculoskeletal: Chest wall soft tissues are unremarkable. No acute
bony abnormality.
IMPRESSION: No acute extra cardiac abnormality.

Aortic atherosclerosis.

## 2022-11-12 NOTE — Progress Notes (Signed)
  Electrophysiology Office Note:   Date:  11/13/2022  ID:  Stephanie Odom, DOB [birth year], MRN [id number]  Primary Cardiologist: None Electrophysiologist: Sherryl Manges, MD   History of Present Illness:   Stephanie Odom is a 77 y.o. female with h/o DM2, HTN, palpitations, sinus tach, and vertigo seen today for routine electrophysiology followup. Since last being seen in our clinic the patient reports doing well from a cardiac perspective.     For the past 3 weeks she has had a worsening of her vertigo symptoms, and wanted to make sure it wasn't cardiac related. She has dizziness when rapidly turning, or bending over and standing up. Denies dizziness in bed, but complains of a room spinning dizziness while seated in the exam room. She has been taking at least 2 separate allergy medications (claritin and zyrtex) as well as multiple OTC supplements. She denies exertional chest pain,   she denies chest pain, palpitations, dyspnea, PND, orthopnea, nausea, vomiting, dizziness, syncope, edema, weight gain, or early satiety.   Review of systems complete and found to be negative unless listed in HPI.   Studies Reviewed:    EKG is ordered today. Personal review shows NSR at 64 bpm    Risk Assessment/Calculations:        Physical Exam:   VS:  BP (!) 142/74   Pulse 64   Ht 5' 2.5" (1.588 m)   Wt 133 lb 6.4 oz (60.5 kg)   SpO2 98%   BMI 24.01 kg/m    Wt Readings from Last 3 Encounters:  11/13/22 133 lb 6.4 oz (60.5 kg)  03/13/22 126 lb (57.2 kg)  07/09/21 125 lb 12.8 oz (57.1 kg)     GEN: Well nourished, well developed in no acute distress NECK: No JVD; No carotid bruits CARDIAC: Regular rate and rhythm, no murmurs, rubs, gallops RESPIRATORY:  Clear to auscultation without rales, wheezing or rhonchi  ABDOMEN: Soft, non-tender, non-distended EXTREMITIES:  No edema; No deformity   ASSESSMENT AND PLAN:    Palpitations Sinus tachycardia Well controlled on atenolol  HTN Stable on  current regimen   Vertigo Chronic, with recent exacerbation Complains of sinus issues and taking multiple antihistamines and OTC supplements. Explained that taking multiple antihistamines is just as likely to worsen her dizziness as it is to improve it.  Recommended PCP f/u.   Follow up with Dr. Graciela Husbands in 12 months  Signed, Graciella Freer, PA-C

## 2022-11-13 ENCOUNTER — Ambulatory Visit: Payer: Medicare Other | Attending: Student | Admitting: Student

## 2022-11-13 ENCOUNTER — Encounter: Payer: Self-pay | Admitting: Student

## 2022-11-13 VITALS — BP 142/74 | HR 64 | Ht 62.5 in | Wt 133.4 lb

## 2022-11-13 DIAGNOSIS — R42 Dizziness and giddiness: Secondary | ICD-10-CM | POA: Diagnosis not present

## 2022-11-13 DIAGNOSIS — I10 Essential (primary) hypertension: Secondary | ICD-10-CM

## 2022-11-13 DIAGNOSIS — R000 Tachycardia, unspecified: Secondary | ICD-10-CM | POA: Diagnosis not present

## 2022-11-13 NOTE — Patient Instructions (Signed)
Medication Instructions:    Your physician recommends that you continue on your current medications as directed. Please refer to the Current Medication list given to you today.   *If you need a refill on your cardiac medications before your next appointment, please call your pharmacy*   Lab Work: NONE ORDERED  TODAY   If you have labs (blood work) drawn today and your tests are completely normal, you will receive your results only by: MyChart Message (if you have MyChart) OR A paper copy in the mail If you have any lab test that is abnormal or we need to change your treatment, we will call you to review the results.   Testing/Procedures: NONE ORDERED  TODAY     Follow-Up: At [facility], you and your health needs are our priority.  As part of our continuing mission to provide you with exceptional heart care, we have created designated Provider Care Teams.  These Care Teams include your primary Cardiologist (physician) and Advanced Practice Providers (APPs -  Physician Assistants and Nurse Practitioners) who all work together to provide you with the care you need, when you need it.  We recommend signing up for the patient portal called "MyChart".  Sign up information is provided on this After Visit Summary.  MyChart is used to connect with patients for Virtual Visits (Telemedicine).  Patients are able to view lab/test results, encounter notes, upcoming appointments, etc.  Non-urgent messages can be sent to your provider as well.   To learn more about what you can do with MyChart, go to [email or web address].     Your next appointment:    1 year(s)  Provider:   Casimiro Needle "Otilio Saber, PA-C    Other Instructions

## 2023-09-21 ENCOUNTER — Ambulatory Visit: Payer: Medicare Other | Admitting: Internal Medicine

## 2023-10-30 ENCOUNTER — Ambulatory Visit: Payer: Medicare Other | Attending: Internal Medicine | Admitting: Internal Medicine

## 2023-10-30 ENCOUNTER — Encounter: Payer: Self-pay | Admitting: Internal Medicine

## 2023-10-30 VITALS — BP 140/68 | HR 63 | Ht 62.5 in | Wt 135.2 lb

## 2023-10-30 DIAGNOSIS — R000 Tachycardia, unspecified: Secondary | ICD-10-CM | POA: Diagnosis not present

## 2023-10-30 DIAGNOSIS — R002 Palpitations: Secondary | ICD-10-CM | POA: Diagnosis not present

## 2023-10-30 NOTE — Progress Notes (Signed)
      Patient Care Team: Elder Negus, Cordelia Poche as PCP - General (Physician Assistant) Duke Salvia, MD as PCP - Electrophysiology (Cardiology)   HPI  Stephanie Odom is a 78 y.o. female seen in followup for palpitations assoc with sinus tachy, treated with low dose beta blockers.  She also takes the vagolytic amitriptyline   Intercurrent 1/25 pneumonia and COVID; recent UTI   The patient denies chest pain, shortness of breath, nocturnal dyspnea, orthopnea or peripheral edema.  There have been no palpitations, lightheadedness or syncope. Marland Kitchen    DATE TEST EF   2009 Myoview  65 %   12/17 Echo   55-65 %   1/18 CPX  No clear limitations  1/23 CTA  CAScore 241   Date Cr K Hgb  12/22 0.59 4.5              Records and Results Reviewed   Past Medical History:  Diagnosis Date   Chest pain    history of   DM (diabetes mellitus) (HCC)    Elevated transaminase level    Being followed by PCP   HTN (hypertension)    Sinus bradycardia     History reviewed. No pertinent surgical history.  Current Outpatient Medications  Medication Sig Dispense Refill   atenolol (TENORMIN) 25 MG tablet Take 1 tablet (25 mg total) by mouth daily. NEED APPOINTMENT FOR FUTURE REFILLS OR 90-DAY SUPPLY. 30 tablet 0   celecoxib (CELEBREX) 200 MG capsule Take 200 mg by mouth daily.     CIPRO 500 MG tablet Take 500 mg by mouth 2 (two) times daily. For 7 days     lisinopril (ZESTRIL) 20 MG tablet Take 20 mg by mouth daily.     LORazepam (ATIVAN) 2 MG tablet Take 2 mg by mouth 2 (two) times daily.     metFORMIN (GLUCOPHAGE) 1000 MG tablet Take 1,000 mg by mouth 2 (two) times daily.     montelukast (SINGULAIR) 10 MG tablet Take 1 tablet by mouth daily.     simvastatin (ZOCOR) 10 MG tablet Take 10 mg by mouth at bedtime.     No current facility-administered medications for this visit.    Allergies  Allergen Reactions   Zostavax [Zoster Vaccine Live] Other (See Comments)    Pt states she received the  shingles vaccine and broke out in a rash      Review of Systems negative except from HPI and PMH  Physical Exam BP (!) 140/68   Pulse 63   Ht 5' 2.5" (1.588 m)   Wt 135 lb 3.2 oz (61.3 kg)   SpO2 95%   BMI 24.33 kg/m  Well developed and nourished in no acute distress HENT normal Neck supplle Clear Regular rate and rhythm, no murmurs or gallops Abd-soft with active BS No Clubbing cyanosis edema Skin-warm and dry A & Oriented  Grossly normal sensory and motor function  ECG sinus @ 63  13/08/42   Assessment and  Plan  Palpitations-- sinus tach  Hypertension  Coronary calcification   Palpitations quiescent  continue atenolol  BP reasonably controlled continue zestril  CaScore--on statin

## 2023-10-30 NOTE — Patient Instructions (Signed)
 Medication Instructions:  Your physician recommends that you continue on your current medications as directed. Please refer to the Current Medication list given to you today.  *If you need a refill on your cardiac medications before your next appointment, please call your pharmacy*  Lab Work: None ordered.  If you have labs (blood work) drawn today and your tests are completely normal, you will receive your results only by: MyChart Message (if you have MyChart) OR A paper copy in the mail If you have any lab test that is abnormal or we need to change your treatment, we will call you to review the results.  Testing/Procedures: None ordered.   Follow-Up: At [facility], you and your health needs are our priority.  As part of our continuing mission to provide you with exceptional heart care, our providers are all part of one team.  This team includes your primary Cardiologist (physician) and Advanced Practice Providers or APPs (Physician Assistants and Nurse Practitioners) who all work together to provide you with the care you need, when you need it.  Your next appointment:   2 years with Stephanie Odom      1st Floor: - Lobby - Registration  - Pharmacy  - Lab - Cafe  2nd Floor: - PV Lab - Diagnostic Testing (echo, CT, nuclear med)  3rd Floor: - Vacant  4th Floor: - TCTS (cardiothoracic surgery) - AFib Clinic - Structural Heart Clinic - Vascular Surgery  - Vascular Ultrasound  5th Floor: - HeartCare Cardiology (general and EP) - Clinical Pharmacy for coumadin, hypertension, lipid, weight-loss medications, and med management appointments    Valet parking services will be available as well.
